# Patient Record
Sex: Female | Born: 1995
Health system: Southern US, Community
[De-identification: ages and names within clinical notes are randomized; demographics above are authoritative.]

## PROBLEM LIST (undated history)

## (undated) ENCOUNTER — Emergency Department (HOSPITAL_COMMUNITY): Admission: EM | Payer: Medicaid Other | Source: Home / Self Care

## (undated) DIAGNOSIS — Z789 Other specified health status: Secondary | ICD-10-CM

## (undated) DIAGNOSIS — E559 Vitamin D deficiency, unspecified: Secondary | ICD-10-CM

## (undated) HISTORY — DX: Other specified health status: Z78.9

## (undated) HISTORY — PX: NO PAST SURGERIES: SHX2092

---

## 2014-10-09 ENCOUNTER — Encounter (HOSPITAL_COMMUNITY): Payer: Self-pay | Admitting: *Deleted

## 2014-10-09 DIAGNOSIS — R1030 Lower abdominal pain, unspecified: Secondary | ICD-10-CM | POA: Insufficient documentation

## 2014-10-09 DIAGNOSIS — R110 Nausea: Secondary | ICD-10-CM | POA: Insufficient documentation

## 2014-10-09 DIAGNOSIS — R109 Unspecified abdominal pain: Secondary | ICD-10-CM | POA: Diagnosis present

## 2014-10-09 DIAGNOSIS — N939 Abnormal uterine and vaginal bleeding, unspecified: Secondary | ICD-10-CM | POA: Diagnosis not present

## 2014-10-09 LAB — CBC WITH DIFFERENTIAL/PLATELET
Basophils Absolute: 0 10*3/uL (ref 0.0–0.1)
Basophils Relative: 0 % (ref 0–1)
EOS ABS: 0.1 10*3/uL (ref 0.0–0.7)
Eosinophils Relative: 1 % (ref 0–5)
HCT: 38.9 % (ref 36.0–46.0)
Hemoglobin: 13 g/dL (ref 12.0–15.0)
Lymphocytes Relative: 42 % (ref 12–46)
Lymphs Abs: 3.2 10*3/uL (ref 0.7–4.0)
MCH: 29.3 pg (ref 26.0–34.0)
MCHC: 33.4 g/dL (ref 30.0–36.0)
MCV: 87.8 fL (ref 78.0–100.0)
MONO ABS: 0.4 10*3/uL (ref 0.1–1.0)
MONOS PCT: 5 % (ref 3–12)
Neutro Abs: 4.1 10*3/uL (ref 1.7–7.7)
Neutrophils Relative %: 52 % (ref 43–77)
Platelets: 262 10*3/uL (ref 150–400)
RBC: 4.43 MIL/uL (ref 3.87–5.11)
RDW: 13.3 % (ref 11.5–15.5)
WBC: 7.8 10*3/uL (ref 4.0–10.5)

## 2014-10-09 LAB — COMPREHENSIVE METABOLIC PANEL
ALK PHOS: 92 U/L (ref 39–117)
ALT: 13 U/L (ref 0–35)
ANION GAP: 14 (ref 5–15)
AST: 16 U/L (ref 0–37)
Albumin: 3.4 g/dL — ABNORMAL LOW (ref 3.5–5.2)
BUN: 13 mg/dL (ref 6–23)
CALCIUM: 9.4 mg/dL (ref 8.4–10.5)
CO2: 23 mEq/L (ref 19–32)
Chloride: 104 mEq/L (ref 96–112)
Creatinine, Ser: 0.9 mg/dL (ref 0.50–1.10)
GFR calc non Af Amer: >90 mL/min (ref >90)
GLUCOSE: 109 mg/dL — AB (ref 70–99)
Potassium: 3.9 mEq/L (ref 3.7–5.3)
Sodium: 141 mEq/L (ref 137–147)
TOTAL PROTEIN: 7.6 g/dL (ref 6.0–8.3)
Total Bilirubin: 0.4 mg/dL (ref 0.3–1.2)

## 2014-10-09 LAB — URINALYSIS, ROUTINE W REFLEX MICROSCOPIC
Bilirubin Urine: NEGATIVE
Glucose, UA: NEGATIVE mg/dL
HGB URINE DIPSTICK: NEGATIVE
Ketones, ur: NEGATIVE mg/dL
Leukocytes, UA: NEGATIVE
Nitrite: NEGATIVE
PROTEIN: NEGATIVE mg/dL
Specific Gravity, Urine: 1.025 (ref 1.005–1.030)
UROBILINOGEN UA: 1 mg/dL (ref 0.0–1.0)
pH: 6 (ref 5.0–8.0)

## 2014-10-09 LAB — LIPASE, BLOOD: Lipase: 24 U/L (ref 11–59)

## 2014-10-09 LAB — PREGNANCY, URINE: Preg Test, Ur: NEGATIVE

## 2014-10-09 NOTE — ED Notes (Signed)
The pt is c/o lower abd and  Lower back pain for 2-3 days.  lmp 7 years depo shot.  No period fior 7 years until 2 nights ago when she woke up bleeding.  Minimal blood

## 2014-10-10 ENCOUNTER — Emergency Department (HOSPITAL_COMMUNITY)
Admission: EM | Admit: 2014-10-10 | Discharge: 2014-10-10 | Disposition: A | Discharge status: Home / Self Care | Payer: Medicaid Other | Attending: Emergency Medicine | Admitting: Emergency Medicine

## 2014-10-10 DIAGNOSIS — R1030 Lower abdominal pain, unspecified: Secondary | ICD-10-CM

## 2014-10-10 NOTE — ED Provider Notes (Signed)
CSN: [id number]     Arrival date & time 10/09/14  2143 History   First MD Initiated Contact with Patient 10/10/14 0111     Chief Complaint  Patient presents with  . Abdominal Pain      Patient is a 18 y.o. female presenting with abdominal pain. The history is provided by the patient.  Abdominal Pain Pain location:  LLQ and RLQ Pain quality: cramping   Pain radiates to:  Does not radiate Pain severity:  Mild Onset quality:  Gradual Duration:  2 weeks Timing:  Intermittent Progression:  Unchanged Chronicity:  New Relieved by:  None tried Worsened by:  Nothing tried Associated symptoms: nausea and vaginal bleeding   Associated symptoms: no diarrhea, no dysuria, no fever, no vaginal discharge and no vomiting     PMH - none  History  Substance Use Topics  . Smoking status: Never Smoker   . Smokeless tobacco: Not on file  . Alcohol Use: Yes   OB History    No data available     Review of Systems  Constitutional: Negative for fever.  Gastrointestinal: Positive for nausea and abdominal pain. Negative for vomiting and diarrhea.  Genitourinary: Positive for vaginal bleeding. Negative for dysuria and vaginal discharge.  All other systems reviewed and are negative.     Allergies  Review of patient's allergies indicates no known allergies.  Home Medications   Prior to Admission medications   Not on File   BP 113/72 mmHg  Pulse 84  Temp(Src) 98.6 F (37 C)  Resp 15  Wt 305 lb 2 oz (138.404 kg)  SpO2 100% Physical Exam CONSTITUTIONAL: Well developed/well nourished HEAD: Normocephalic/atraumatic EYES: EOMI/PERRL ENMT: Mucous membranes moist NECK: supple no meningeal signs SPINE:entire spine nontender CV: S1/S2 noted, no murmurs/rubs/gallops noted LUNGS: Lungs are clear to auscultation bilaterally, no apparent distress ABDOMEN: soft, nontender, no rebound or guarding GU:no cva tenderness NEURO: Pt is awake/alert, moves all extremitiesx4 EXTREMITIES: pulses  normal, full ROM SKIN: warm, color normal PSYCH: no abnormalities of mood noted  ED Course  Procedures  Labs Review Labs Reviewed  COMPREHENSIVE METABOLIC PANEL - Abnormal; Notable for the following:    Glucose, Bld 109 (*)    Albumin 3.4 (*)    All other components within normal limits  CBC WITH DIFFERENTIAL  LIPASE, BLOOD  URINALYSIS, ROUTINE W REFLEX MICROSCOPIC  PREGNANCY, URINE    Pt refused pelvic exam She requested discharge home We discussed strict return precautions   MDM   Final diagnoses:  Lower abdominal pain    Nursing notes including past medical history and social history reviewed and considered in documentation Labs/vital reviewed and considered     Joya Gaskinsonald W [place] Sarinana, MD 10/10/14 [phone]

## 2014-10-10 NOTE — Discharge Instructions (Signed)

## 2016-02-05 ENCOUNTER — Emergency Department (HOSPITAL_COMMUNITY)
Admission: EM | Admit: 2016-02-05 | Discharge: 2016-02-05 | Disposition: A | Discharge status: Home / Self Care | Payer: Medicaid Other | Attending: Emergency Medicine | Admitting: Emergency Medicine

## 2016-02-05 ENCOUNTER — Encounter (HOSPITAL_COMMUNITY): Payer: Self-pay | Admitting: Emergency Medicine

## 2016-02-05 DIAGNOSIS — M654 Radial styloid tenosynovitis [de Quervain]: Secondary | ICD-10-CM | POA: Insufficient documentation

## 2016-02-05 MED ORDER — TRAMADOL HCL 50 MG PO TABS: 50.0000 mg | ORAL_TABLET | Freq: Four times a day (QID) | ORAL | Status: DC | PRN

## 2016-02-05 MED ORDER — IBUPROFEN 800 MG PO TABS: 800.0000 mg | ORAL_TABLET | Freq: Three times a day (TID) | ORAL | Status: DC | PRN

## 2016-02-05 NOTE — Discharge Instructions (Signed)
All of the hand surgeon provided.  Return here as needed.  Ice and heat to your wrist.  Wear the splint for comfort

## 2016-02-05 NOTE — ED Provider Notes (Signed)
CSN: [id number]     Arrival date & time 02/05/16  1301 History  By signing my name below, I, Kristine Garcia, attest that this documentation has been prepared under the direction and in the presence of Ebbie Ridgehris [name], [employer] CorporationPA-C Electronically Signed: Soijett Garcia, ED Scribe. 02/05/2016. 1:32 PM.   Chief Complaint  Patient presents with  . Wrist Pain      The history is provided by the patient. No language interpreter was used.    Kristine Garcia is a 20 y.o. female who presents to the Emergency Department complaining of constant, non-radiating, left wrist pain onset 5 days. Pt initially thought that she slept on her left wrist incorrectly, but she has pain primarily when the area is palpated. Pt states that there is a "popping" sensation to the left wrist. Pt denies any injury/truama to the area. She notes that she has not tried any medications for the relief of her symptoms. She denies color change, wound, rash, left elbow pain, and any other symptoms.    History reviewed. No pertinent past medical history. History reviewed. No pertinent past surgical history. No family history on file. Social History  Substance Use Topics  . Smoking status: Never Smoker   . Smokeless tobacco: None  . Alcohol Use: Yes   OB History    No data available     Review of Systems  Musculoskeletal: Positive for arthralgias. Negative for joint swelling.  Skin: Negative for color change, rash and wound.      Allergies  Review of patient's allergies indicates no known allergies.  Home Medications   Prior to Admission medications   Not on File   BP 109/73 mmHg  Pulse 75  Temp(Src) 98.4 F (36.9 C) (Oral)  Resp 18  Ht 5\' 8"  (1.727 m)  Wt 309 lb 2 oz (140.218 kg)  BMI 47.01 kg/m2  SpO2 99% Physical Exam  Constitutional: She is oriented to person, place, and time. She appears well-developed and well-nourished. No distress.  HENT:  Head: Normocephalic and atraumatic.  Eyes: EOM are normal.  Neck: Neck  supple.  Cardiovascular: Normal rate.   Pulmonary/Chest: Effort normal. No respiratory distress.  Musculoskeletal: Normal range of motion.  Left wrist: Tender over the lateral wrist and with flexion and extension. Flexion increases the pain.   Neurological: She is alert and oriented to person, place, and time.  Skin: Skin is warm and dry.  Psychiatric: She has a normal mood and affect. Her behavior is normal.  Nursing note and vitals reviewed.   ED Course  Procedures (including critical care time) DIAGNOSTIC STUDIES: Oxygen Saturation is 99% on RA, nl by my interpretation.    COORDINATION OF CARE: 1:31 PM Discussed treatment plan with pt at bedside which includes alternate ice and heat, splint, follow up with orthopedist, and pt agreed to plan.    Patient has no trauma noted to the wrist the field, the patient has tendinitis and she has a positive de Quervain's sign.  Patient will be advised ice and elevate her wrist.  We will give her follow-up with hand.  Told to return here as needed  Charlestine NightChristopher Emilyrose Darrah, PA-C 02/05/16 1338  Raeford RazorStephen Kohut, MD 02/06/16 [phone]

## 2016-02-05 NOTE — ED Notes (Signed)
Pt c/o pain to left wrist onset Monday. Pt denies injury.

## 2016-02-05 NOTE — ED Notes (Signed)
Declined W/C at D/C and was escorted to lobby by RN. 

## 2016-05-14 ENCOUNTER — Encounter (HOSPITAL_COMMUNITY): Payer: Self-pay

## 2016-05-14 ENCOUNTER — Emergency Department (HOSPITAL_COMMUNITY)
Admission: EM | Admit: 2016-05-14 | Discharge: 2016-05-14 | Disposition: A | Discharge status: Home / Self Care | Payer: Medicaid Other | Attending: Emergency Medicine | Admitting: Emergency Medicine

## 2016-05-14 DIAGNOSIS — Z791 Long term (current) use of non-steroidal anti-inflammatories (NSAID): Secondary | ICD-10-CM | POA: Insufficient documentation

## 2016-05-14 DIAGNOSIS — R6883 Chills (without fever): Secondary | ICD-10-CM | POA: Insufficient documentation

## 2016-05-14 DIAGNOSIS — R51 Headache: Secondary | ICD-10-CM | POA: Insufficient documentation

## 2016-05-14 DIAGNOSIS — M791 Myalgia: Secondary | ICD-10-CM | POA: Insufficient documentation

## 2016-05-14 DIAGNOSIS — J029 Acute pharyngitis, unspecified: Secondary | ICD-10-CM

## 2016-05-14 MED ORDER — IBUPROFEN 600 MG PO TABS: 600.0000 mg | ORAL_TABLET | Freq: Four times a day (QID) | ORAL | Status: DC | PRN

## 2016-05-14 MED ORDER — HYDROCODONE-ACETAMINOPHEN 7.5-325 MG/15ML PO SOLN: 15.0000 mL | Freq: Three times a day (TID) | ORAL | Status: DC | PRN

## 2016-05-14 MED ORDER — PREDNISONE 20 MG PO TABS: 40.0000 mg | ORAL_TABLET | Freq: Every day | ORAL | Status: DC

## 2016-05-14 NOTE — ED Provider Notes (Signed)
CSN: [id number]     Arrival date & time 05/14/16  1634 History  By signing my name below, I, Evon Slackerrance Branch, attest that this documentation has been prepared under the direction and in the presence of [employer]ole Elizabeth Ashvin Adelson, [place]. Electronically Signed: Evon Slackerrance Branch, ED Scribe. 05/14/2016. 5:11 PM.    Chief Complaint  Patient presents with  . Sore Throat   The history is provided by the patient. No language interpreter was used.   HPI Comments: Antoine Primasyesha Majid is a 20 y.o. female who presents to the Emergency Department complaining of constant worsening sore throat onset 2 days prior. Pt reports associated chills, generalized myalgias, HA, rhinorrhea. Pt also reports having a cough with associated episode of post tussive vomiting. Pt states that she has tried ibuprofen PTA with temporary relief. Pt states that the pain is worse when swallowing. Denies voice change, trouble swallowing, neck swelling, ear pain, wheezing, SOB, CP or congestion. Pt denies recent sick contacts.   History reviewed. No pertinent past medical history. History reviewed. No pertinent past surgical history. No family history on file. Social History  Substance Use Topics  . Smoking status: Never Smoker   . Smokeless tobacco: None  . Alcohol Use: Yes   OB History    No data available      Review of Systems  Constitutional: Positive for chills.  HENT: Positive for sore throat. Negative for congestion, ear pain, sinus pressure, trouble swallowing and voice change.   Respiratory: Positive for cough. Negative for shortness of breath.   Cardiovascular: Negative for chest pain.  Gastrointestinal: Negative for nausea and vomiting.  Musculoskeletal: Positive for myalgias.  Neurological: Positive for headaches.     Allergies  Review of patient's allergies indicates no known allergies.  Home Medications   Prior to Admission medications   Medication Sig Start Date End Date Taking? Authorizing Provider   HYDROcodone-acetaminophen (HYCET) 7.5-325 mg/15 ml solution Take 15 mLs by mouth every 8 (eight) hours as needed for moderate pain. 05/14/16   Barrett HenleNicole Elizabeth Tanisa Lagace, PA-C  ibuprofen (ADVIL,MOTRIN) 600 MG tablet Take 1 tablet (600 mg total) by mouth every 6 (six) hours as needed. 05/14/16   Barrett HenleNicole Elizabeth Marilouise Densmore, PA-C  predniSONE (DELTASONE) 20 MG tablet Take 2 tablets (40 mg total) by mouth daily. 05/14/16   Barrett HenleNicole Elizabeth Maxey Ransom, PA-C  traMADol (ULTRAM) 50 MG tablet Take 1 tablet (50 mg total) by mouth every 6 (six) hours as needed. 02/05/16   Christopher Lawyer, PA-C   BP 120/74 mmHg  Pulse 98  Temp(Src) 99.2 F (37.3 C) (Oral)  Resp 18  SpO2 99%   Physical Exam  Constitutional: She is oriented to person, place, and time. She appears well-developed and well-nourished.  HENT:  Head: Normocephalic and atraumatic.  Right Ear: Tympanic membrane normal.  Left Ear: Tympanic membrane normal.  Nose: Nose normal. Right sinus exhibits no maxillary sinus tenderness and no frontal sinus tenderness. Left sinus exhibits no maxillary sinus tenderness and no frontal sinus tenderness.  Mouth/Throat: Uvula is midline and mucous membranes are normal. No oral lesions. No trismus in the jaw. No uvula swelling. Posterior oropharyngeal erythema present. No oropharyngeal exudate, posterior oropharyngeal edema or tonsillar abscesses.  Eyes: Conjunctivae and EOM are normal. Right eye exhibits no discharge. Left eye exhibits no discharge. No scleral icterus.  Neck: Normal range of motion. Neck supple.  Cardiovascular: Normal rate, regular rhythm, normal heart sounds and intact distal pulses.   Pulmonary/Chest: Effort normal and breath sounds normal. No respiratory distress. She has no  wheezes. She has no rales. She exhibits no tenderness.  Abdominal: Soft. She exhibits no distension.  Musculoskeletal: Normal range of motion. She exhibits no edema.  Lymphadenopathy:    She has no cervical adenopathy.   Neurological: She is alert and oriented to person, place, and time.  Skin: Skin is warm and dry.  Nursing note and vitals reviewed.   ED Course  Procedures (including critical care time) DIAGNOSTIC STUDIES: Oxygen Saturation is 99% on RA, normal by my interpretation.    COORDINATION OF CARE: 5:11 PM-Discussed treatment plan with pt at bedside and pt agreed to plan.     Labs Review Labs Reviewed - No data to display  Imaging Review No results found.    EKG Interpretation None      MDM   Final diagnoses:  Sore throat   Pt afebrile with tonsillar swelling and erythema and dysphagia, no cervical lymphadenopathy. Centor criteria score 1, strep test not indicated. I suspect patient's symptoms are likely due to viral pharyngitis.  Pt appears well hydrated, discussed importance of continued water rehydration. Presentation non concerning for PTA or infxn spread to soft tissue. No trismus or uvula deviation. Specific return precautions discussed. Plan to discharge patient home with steroids, NSAIDs and symptomatic treatment. Patient given info to follow-up with PCP outpatient. Discussed return precautions with patient.   I personally performed the services described in this documentation, which was scribed in my presence. The recorded information has been reviewed and is accurate.      Satira Sark Avella, [place] 05/14/16 1729  Alvira Monday, MD 05/15/16 1226

## 2016-05-14 NOTE — ED Notes (Signed)
Patient complains of 2 days of cough, congestion, and sore throat

## 2016-05-14 NOTE — Discharge Instructions (Signed)
Take your medications as prescribed. Please follow up with a primary care provider from the Resource Guide provided below in 5 days if your symptoms have not improved. Please return to the Emergency Department if symptoms worsen or new onset of fever, facial/neck swelling, neck stiffness, unable to open jaw, change in voice, drooling, unable to swallow, difficulty breathing.

## 2016-05-14 NOTE — ED Notes (Signed)
Declined W/C at D/C and was escorted to lobby by RN. 

## 2016-06-26 ENCOUNTER — Encounter (HOSPITAL_COMMUNITY): Payer: Self-pay

## 2016-06-26 DIAGNOSIS — Z202 Contact with and (suspected) exposure to infections with a predominantly sexual mode of transmission: Secondary | ICD-10-CM | POA: Insufficient documentation

## 2016-06-26 DIAGNOSIS — Z5321 Procedure and treatment not carried out due to patient leaving prior to being seen by health care provider: Secondary | ICD-10-CM | POA: Insufficient documentation

## 2016-06-26 NOTE — ED Triage Notes (Signed)
Pt states recent exposure to herpes. Requesting testing. Denies any pain or discharge.

## 2016-06-27 ENCOUNTER — Ambulatory Visit (HOSPITAL_COMMUNITY)
Admission: EM | Admit: 2016-06-27 | Discharge: 2016-06-27 | Disposition: A | Discharge status: Home / Self Care | Payer: Medicaid Other | Attending: Internal Medicine | Admitting: Internal Medicine

## 2016-06-27 ENCOUNTER — Encounter (HOSPITAL_COMMUNITY): Payer: Self-pay | Admitting: Emergency Medicine

## 2016-06-27 ENCOUNTER — Emergency Department (HOSPITAL_COMMUNITY)
Admission: EM | Admit: 2016-06-27 | Discharge: 2016-06-27 | Disposition: A | Discharge status: Home / Self Care | Payer: Medicaid Other | Attending: Dermatology | Admitting: Dermatology

## 2016-06-27 DIAGNOSIS — A6000 Herpesviral infection of urogenital system, unspecified: Secondary | ICD-10-CM

## 2016-06-27 MED ORDER — ACYCLOVIR 400 MG PO TABS: 400.0000 mg | ORAL_TABLET | Freq: Three times a day (TID) | ORAL | 0 refills | Status: AC

## 2016-06-27 NOTE — ED Triage Notes (Addendum)
Pt is concerned that she was exposed to Herpes. No symptoms.

## 2016-06-27 NOTE — ED Provider Notes (Signed)
[facility]    CSN: [id number] Arrival date & time: 06/27/16  1004  First Provider Contact:  None       History   Chief Complaint Chief Complaint  Patient presents with  . Exposure to STD    HPI Kristine Garcia is a 20 y.o. female. She presents today with a 24-hour history of a tingling discomfort in the right anterior vulvar area. A recent sexual partner has a history of herpes. No unusual vaginal discharge/bleeding. No pelvic discomfort. No dysuria. No change in bowel habits. She declines STD testing, and says that she has had this done recently. She is mostly concerned about whether she has a herpes outbreak in the area of discomfort.  HPI  History reviewed. No pertinent past medical history.  There are no active problems to display for this patient.   History reviewed. No pertinent surgical history.     Home Medications    Prior to Admission medications   Medication Sig Start Date End Date Taking? Authorizing Provider  HYDROcodone-acetaminophen (HYCET) 7.5-325 mg/15 ml solution Take 15 mLs by mouth every 8 (eight) hours as needed for moderate pain. 05/14/16   Barrett Henle, PA-C  ibuprofen (ADVIL,MOTRIN) 600 MG tablet Take 1 tablet (600 mg total) by mouth every 6 (six) hours as needed. 05/14/16   Barrett Henle, PA-C  predniSONE (DELTASONE) 20 MG tablet Take 2 tablets (40 mg total) by mouth daily. 05/14/16   Barrett Henle, PA-C  traMADol (ULTRAM) 50 MG tablet Take 1 tablet (50 mg total) by mouth every 6 (six) hours as needed. 02/05/16   Charlestine Night, PA-C    Family History No family history on file.  Social History Social History  Substance Use Topics  . Smoking status: Never Smoker  . Smokeless tobacco: Never Used  . Alcohol use Yes     Comment: social     Allergies   Review of patient's allergies indicates no known allergies.   Review of Systems Review of Systems  All other systems reviewed and are  negative.    Physical Exam Triage Vital Signs ED Triage Vitals  Enc Vitals Group     BP 06/27/16 1045 (!) 156/113     Pulse Rate 06/27/16 1045 78     Resp 06/27/16 1045 18     Temp 06/27/16 1045 98.6 F (37 C)     Temp Source 06/27/16 1045 Oral     SpO2 06/27/16 1045 100 %     Pain Score 06/27/16 1146 0    Updated Vital Signs BP 127/81 (BP Location: Left Arm)   Pulse 78   Temp 98.6 F (37 C) (Oral)   Resp 18   SpO2 100%  Physical Exam  Constitutional: She appears well-developed and well-nourished. No distress.  HENT:  Head: Normocephalic and atraumatic.  Eyes: Conjunctivae are normal.  Neck: Neck supple.  Cardiovascular: Normal rate.   No murmur heard. Pulmonary/Chest: Effort normal. No respiratory distress.  Abdominal: There is no tenderness.  Genitourinary:  Genitourinary Comments: Vulvar exam is notable for an irregular 2 x 3 cm shallow erosion in the right periurethral/periclitoral area. This is tender, and consistent with a herpetic erosion.  Musculoskeletal: She exhibits no edema.  Neurological: She is alert.  Skin: Skin is warm and dry.  Psychiatric: She has a normal mood and affect.  Nursing note and vitals reviewed.    UC Treatments / Results  Labs (all labs ordered are listed, but only abnormal results are displayed) Labs Reviewed -  No data to display  EKG  EKG Interpretation None       Radiology No results found.  Procedures Procedures (including critical care time) none  Medications Ordered in UC Medications - No data to display   Final Clinical Impressions(s) / UC Diagnoses   Final diagnoses:  Genital HSV    New Prescriptions Discharge Medication List as of 06/27/2016 12:30 PM    START taking these medications   Details  acyclovir (ZOVIRAX) 400 MG tablet Take 1 tablet (400 mg total) by mouth 3 (three) times daily., Starting Tue 06/27/2016, Until Tue 07/04/2016, Normal         Eustace Moore, MD 07/06/16 [phone]

## 2016-06-27 NOTE — ED Notes (Signed)
Plan  Of  Care  Discussed   With  Pt

## 2016-06-27 NOTE — Discharge Instructions (Signed)
Prescription for acyclovir was sent to the [facility] on [address].

## 2016-07-12 ENCOUNTER — Ambulatory Visit (HOSPITAL_COMMUNITY)
Admission: EM | Admit: 2016-07-12 | Discharge: 2016-07-12 | Disposition: A | Discharge status: Home / Self Care | Payer: Medicaid Other | Attending: Family Medicine | Admitting: Family Medicine

## 2016-07-12 ENCOUNTER — Encounter (HOSPITAL_COMMUNITY): Payer: Self-pay | Admitting: Emergency Medicine

## 2016-07-12 DIAGNOSIS — B373 Candidiasis of vulva and vagina: Secondary | ICD-10-CM

## 2016-07-12 DIAGNOSIS — B3731 Acute candidiasis of vulva and vagina: Secondary | ICD-10-CM

## 2016-07-12 MED ORDER — FLUCONAZOLE 150 MG PO TABS: 150.0000 mg | ORAL_TABLET | Freq: Every day | ORAL | 0 refills | Status: DC

## 2016-07-12 NOTE — ED Triage Notes (Signed)
[facility] a [facility] discomfort and itching x 3 weeks. The patient stated that she was diagnosed with herpes on 06/27/2016 and prescribed acyclovir 400 mg which she has finished.

## 2016-07-12 NOTE — ED Provider Notes (Signed)
CSN: [id number]     Arrival date & time 07/12/16  1135 History   First MD Initiated Contact with Patient 07/12/16 1159     Chief Complaint  Patient presents with  . Vaginal Itching   (Consider location/radiation/quality/duration/timing/severity/associated sxs/prior Treatment) [name] is a 20 y.o female with no medical history, presents today for vaginal itchiness x 2 weeks accompany by white thick vaginal discharge . She was recently treated on 07/25 for genital herpes with acyclovir 400mg  TID with a irregular 2 x 3 cm shallow erosion lesion on her external genitalia noted on exam that day. Patient reports that the rash is still present, and would like another stronger course of acyclovir. Patient haven't been sexually active since last visit. She declines HIV testing today.       Vaginal Itching  Pertinent negatives include no chest pain and no shortness of breath.    History reviewed. No pertinent past medical history. History reviewed. No pertinent surgical history. History reviewed. No pertinent family history. Social History  Substance Use Topics  . Smoking status: Never Smoker  . Smokeless tobacco: Never Used  . Alcohol use Yes     Comment: social   OB History    No data available     Review of Systems  Constitutional: Negative for chills, fatigue and fever.  Respiratory: Negative for cough and shortness of breath.   Cardiovascular: Negative for chest pain and palpitations.  Genitourinary: Positive for vaginal discharge. Negative for dysuria, pelvic pain and vaginal pain.       Pos for vaginal itchiness, pos for a rash    Allergies  Review of patient's allergies indicates no known allergies.  Home Medications   Prior to Admission medications   Medication Sig Start Date End Date Taking? Authorizing Provider  fluconazole (DIFLUCAN) 150 MG tablet Take 1 tablet (150 mg total) by mouth daily. Take 1 tab today, repeat in 72 hours if still symptomatic 07/12/16   Lucia EstelleFeng  Kerra Guilfoil, NP  HYDROcodone-acetaminophen (HYCET) 7.5-325 mg/15 ml solution Take 15 mLs by mouth every 8 (eight) hours as needed for moderate pain. 05/14/16   Barrett HenleNicole Elizabeth Nadeau, PA-C  ibuprofen (ADVIL,MOTRIN) 600 MG tablet Take 1 tablet (600 mg total) by mouth every 6 (six) hours as needed. 05/14/16   Barrett HenleNicole Elizabeth Nadeau, PA-C  predniSONE (DELTASONE) 20 MG tablet Take 2 tablets (40 mg total) by mouth daily. 05/14/16   Barrett HenleNicole Elizabeth Nadeau, PA-C  traMADol (ULTRAM) 50 MG tablet Take 1 tablet (50 mg total) by mouth every 6 (six) hours as needed. 02/05/16   Charlestine Nighthristopher Lawyer, PA-C   Meds Ordered and Administered this Visit  Medications - No data to display  BP 137/88 (BP Location: Left Arm)   Pulse 90   Temp 98.9 F (37.2 C) (Oral)   Resp 20   SpO2 100%  No data found.   Physical Exam  Constitutional: She appears well-developed and well-nourished.  Cardiovascular: Normal rate, regular rhythm and normal heart sounds.   Pulmonary/Chest: Effort normal and breath sounds normal.  Genitourinary:  Genitourinary Comments: Patient has no rash on her labia majora or minora. White thick vaginal discharge noted on the vaginal opening, more similar discharge noted in the vaginal canal. N lesion or rash noted on the vaginal canal. Cervix unremarkable. Negative for CMT.   Nursing note and vitals reviewed.   Urgent Care Course   Clinical Course    Procedures (including critical care time)  Labs Review Labs Reviewed  CERVICOVAGINAL ANCILLARY ONLY  Imaging Review No results found.   Visual Acuity Review  Right Eye Distance:   Left Eye Distance:   Bilateral Distance:    Right Eye Near:   Left Eye Near:    Bilateral Near:         MDM   1. Candida vaginitis    Kristine Garcia is a 20 y.o female with no medical history, presents today for vaginal itchiness x 2 weeks accompany by white thick vaginal discharge . She was recently treated on 07/25 for genital herpes with acyclovir   TID with a irregular 2 x 3 cm shallow erosion lesion on her external genitalia noted on exam that day. This rash has resolved and is no longer seem on the exam today. Yeast vaginitis is suspected. Rx given for diflucan; instructed to take 1 tab today and may repeat in 72 hours if she is stisl symptomatic. Instructed to follow up with her primary care provider or return to our urgent care if she does not improve.     Lucia Estelle, NP 07/12/16 1306

## 2016-07-13 LAB — CERVICOVAGINAL ANCILLARY ONLY
CHLAMYDIA, DNA PROBE: NEGATIVE
NEISSERIA GONORRHEA: NEGATIVE
Wet Prep (BD Affirm): POSITIVE — AB

## 2016-07-15 ENCOUNTER — Telehealth (HOSPITAL_COMMUNITY): Payer: Self-pay | Admitting: Emergency Medicine

## 2016-07-15 MED ORDER — METRONIDAZOLE 500 MG PO TABS: 2000.0000 mg | ORAL_TABLET | Freq: Once | ORAL | 0 refills | Status: AC

## 2016-07-15 NOTE — Telephone Encounter (Addendum)
Per Sherrilee GillesZheng, NP,  Notes Recorded by Lucia EstelleFeng Zheng, NP on 07/15/2016 at 10:02 AM EDT Please inform patient that her result came back positive for yeast and trichomonas. She have been already been treated for yeast. For her Trichomonas; please call in Flagyl 500mg  tablets; Take 4 tablets, once.   Called pt and notified of recent lab results from visit 8/9 Pt ID'd properly... Reports feeling better and sx have subsided Adv pt if sx are not getting better to return and to notify partner(s) Per her request, called in Flagyl to [facility] Education on safe sex given Pt verb understanding.

## 2016-09-08 ENCOUNTER — Encounter (HOSPITAL_COMMUNITY): Payer: Self-pay | Admitting: Family Medicine

## 2016-09-08 ENCOUNTER — Ambulatory Visit (HOSPITAL_COMMUNITY)
Admission: EM | Admit: 2016-09-08 | Discharge: 2016-09-08 | Disposition: A | Discharge status: Home / Self Care | Payer: Medicaid Other | Attending: Family Medicine | Admitting: Family Medicine

## 2016-09-08 DIAGNOSIS — Z202 Contact with and (suspected) exposure to infections with a predominantly sexual mode of transmission: Secondary | ICD-10-CM

## 2016-09-08 DIAGNOSIS — L739 Follicular disorder, unspecified: Secondary | ICD-10-CM

## 2016-09-08 DIAGNOSIS — N760 Acute vaginitis: Secondary | ICD-10-CM

## 2016-09-08 DIAGNOSIS — N898 Other specified noninflammatory disorders of vagina: Secondary | ICD-10-CM | POA: Insufficient documentation

## 2016-09-08 MED ORDER — FLUCONAZOLE 150 MG PO TABS: ORAL_TABLET | ORAL | 0 refills | Status: DC

## 2016-09-08 MED ORDER — DOXYCYCLINE HYCLATE 100 MG PO CAPS: 100.0000 mg | ORAL_CAPSULE | Freq: Two times a day (BID) | ORAL | 0 refills | Status: AC

## 2016-09-08 MED ORDER — METRONIDAZOLE 500 MG PO TABS: 500.0000 mg | ORAL_TABLET | Freq: Two times a day (BID) | ORAL | 0 refills | Status: DC

## 2016-09-08 NOTE — Discharge Instructions (Signed)
Start Flagyl 500mg  twice a day for 7 days. Take Diflucan 1 tablet now, repeat 1 tablet in 4 days for yeast. Start Doxycyline twice a day for 7 days for infection. No alcohol. Follow-up pending lab results.

## 2016-09-08 NOTE — ED Triage Notes (Signed)
Pt here for vaginal irritation. sts her friend looked at her private area and saw a red bump.

## 2016-09-08 NOTE — ED Provider Notes (Signed)
CSN: [id number]     Arrival date & time 09/08/16  [id number] History   First MD Initiated Contact with Patient 09/08/16 1043     Chief Complaint  Patient presents with  . Vaginal Itching   (Consider location/radiation/quality/duration/timing/severity/associated sxs/prior Treatment) 20 year old female presents with bump on left inner labial area and vaginal discharge for the past week. Bump is slightly painful but no itching. Denies any fever, abdominal pain or dysuria. She was seen originally on 7/25 and thought to have genital herpes. She was prescribed Acyclovir but lesion did not change. She returned about 2 weeks later with more vaginal discharge and was dx with yeast vaginitis and placed on Diflucan. Lesion had resolved and testing at that time was negative for GC/Chlamydia but positive for yeast and Trichomonas. She took 4 pills of Flagyl but discharge never seemed to completely resolve. She has not been sexually active since her last visit in August.    The history is provided by the patient.    History reviewed. No pertinent past medical history. History reviewed. No pertinent surgical history. History reviewed. No pertinent family history. Social History  Substance Use Topics  . Smoking status: Never Smoker  . Smokeless tobacco: Never Used  . Alcohol use Yes     Comment: social   OB History    No data available     Review of Systems  Constitutional: Negative for chills, fatigue and fever.  Gastrointestinal: Negative for abdominal pain, diarrhea, nausea and vomiting.  Genitourinary: Positive for genital sores and vaginal discharge. Negative for dysuria, hematuria, pelvic pain, vaginal bleeding and vaginal pain.  Skin: Negative for rash.  Neurological: Negative for dizziness, weakness and headaches.  Hematological: Negative for adenopathy. Does not bruise/bleed easily.    Allergies  Review of patient's allergies indicates no known allergies.  Home Medications   Prior to  Admission medications   Medication Sig Start Date End Date Taking? Authorizing Provider  doxycycline (VIBRAMYCIN) 100 MG capsule Take 1 capsule (100 mg total) by mouth 2 (two) times daily. 09/08/16 09/15/16  Sudie GrumblingAnn Berry Swetha Rayle, NP  fluconazole (DIFLUCAN) 150 MG tablet Take 1 tablet by mouth once. Repeat 1 tablet in 4 days. 09/08/16   Sudie GrumblingAnn Berry Ericberto Padget, NP  ibuprofen (ADVIL,MOTRIN) 600 MG tablet Take 1 tablet (600 mg total) by mouth every 6 (six) hours as needed. 05/14/16   Barrett HenleNicole Elizabeth Nadeau, PA-C  metroNIDAZOLE (FLAGYL) 500 MG tablet Take 1 tablet (500 mg total) by mouth 2 (two) times daily. 09/08/16   Sudie GrumblingAnn Berry Thi Klich, NP   Meds Ordered and Administered this Visit  Medications - No data to display  BP (!) 147/104 (BP Location: Left Wrist)   Pulse 92   Temp 98.5 F (36.9 C) (Oral)   Resp 18   LMP 08/23/2016   SpO2 100%  No data found.   Physical Exam  Constitutional: She is oriented to person, place, and time. She appears well-developed and well-nourished. No distress.  Cardiovascular: Normal rate, regular rhythm and normal heart sounds.   Pulmonary/Chest: Effort normal and breath sounds normal.  Abdominal: Soft. Normal appearance. There is no tenderness.  Genitourinary:    There is no rash, tenderness or lesion on the right labia. There is lesion on the left labia. There is no rash or tenderness on the left labia. Cervix exhibits no motion tenderness. Right adnexum displays no tenderness. Left adnexum displays no tenderness. There is tenderness in the vagina. No bleeding in the vagina. Vaginal discharge found.  Genitourinary Comments:  A few small red papular lesions present on left inner labial area. Slightly tender with minimal pus present. No ulcers seen.   Vaginal canal- White thin discharge present with occasional clumps present.   Musculoskeletal: Normal range of motion.  Lymphadenopathy:       Right: No inguinal adenopathy present.       Left: No inguinal adenopathy present.    Neurological: She is alert and oriented to person, place, and time.  Skin: Skin is warm and dry.  Psychiatric: She has a normal mood and affect. Her behavior is normal. Judgment and thought content normal.    Urgent Care Course   Clinical Course    Procedures (including critical care time)  Labs Review Labs Reviewed  HSV 1 ANTIBODY, IGG  HSV 2 ANTIBODY, IGG  CERVICOVAGINAL ANCILLARY ONLY    Imaging Review No results found.   Visual Acuity Review  Right Eye Distance:   Left Eye Distance:   Bilateral Distance:    Right Eye Near:   Left Eye Near:    Bilateral Near:         MDM   1. Acute vaginitis   2. Potential exposure to STD   3. Folliculitis    Discussed that the bumps appear to be folliculitis- will start Doxycycline twice a day as directed. HSV blood work obtained to determine if ever exposed to herpes since uncertain if lesion was actually herpes in July. Discussed vaginitis- may be BV or Trich- start Flagyl 500mg  twice a day as directed for 7 days. Take Diflucan 150mg  1 tablet now- repeat 1 tablet in 4 days. Follow-up pending lab results.     Sudie Grumbling, NP 09/09/16 2116

## 2016-09-09 LAB — HSV 1 ANTIBODY, IGG: HSV 1 Glycoprotein G Ab, IgG: <0.91 index (ref 0.00–0.90)

## 2016-09-09 LAB — HSV 2 ANTIBODY, IGG

## 2016-09-11 LAB — CERVICOVAGINAL ANCILLARY ONLY
CHLAMYDIA, DNA PROBE: NEGATIVE
Neisseria Gonorrhea: NEGATIVE
Wet Prep (BD Affirm): NEGATIVE

## 2016-09-20 ENCOUNTER — Telehealth (HOSPITAL_COMMUNITY): Payer: Self-pay | Admitting: Emergency Medicine

## 2016-09-20 NOTE — Telephone Encounter (Signed)
Pt called needing lab results from visit on 09/08/16  Notified of recent lab results... NEG for HSV 1/2, neg for Gon/Chlam, Trich, BV Pt ID'd properly... Reports feeling better and sx have subsided Adv pt if sx are not getting better to return or to f/u w/PCP Pt verb understanding.

## 2016-12-10 ENCOUNTER — Emergency Department (HOSPITAL_COMMUNITY)
Admission: EM | Admit: 2016-12-10 | Discharge: 2016-12-10 | Disposition: A | Discharge status: Home / Self Care | Payer: Medicaid Other | Attending: Emergency Medicine | Admitting: Emergency Medicine

## 2016-12-10 ENCOUNTER — Encounter (HOSPITAL_COMMUNITY): Payer: Self-pay

## 2016-12-10 DIAGNOSIS — Z79899 Other long term (current) drug therapy: Secondary | ICD-10-CM | POA: Insufficient documentation

## 2016-12-10 DIAGNOSIS — K529 Noninfective gastroenteritis and colitis, unspecified: Secondary | ICD-10-CM | POA: Insufficient documentation

## 2016-12-10 MED ORDER — DIPHENOXYLATE-ATROPINE 2.5-0.025 MG PO TABS: 2.0000 | ORAL_TABLET | Freq: Four times a day (QID) | ORAL | 0 refills | Status: DC | PRN

## 2016-12-10 MED ORDER — ONDANSETRON HCL 4 MG PO TABS: 4.0000 mg | ORAL_TABLET | Freq: Four times a day (QID) | ORAL | 0 refills | Status: DC

## 2016-12-10 NOTE — ED Provider Notes (Signed)
[facility]-EMERGENCY DEPT Provider Note   CSN: [id number] Arrival date & time: 12/10/16  0010  By signing my name below, I, Arianna Nassar, attest that this documentation has been prepared under the direction and in the presence of Gilda Creasehristopher J Jaiquan Temme, MD.  Electronically Signed: Octavia HeirArianna Nassar, ED Scribe. 12/10/16. 2:16 AM.    History   Chief Complaint Chief Complaint  Patient presents with  . Emesis  . Diarrhea   The history is provided by the patient. No language interpreter was used.   HPI Comments: Kristine Garcia is a 21 y.o. female who presents to the Emergency Department complaining of moderate, gradual worsening, diarrhea that began 3 days ago. She has been having associated nausea, vomiting, and rhinorrhea. Pt was sent to the ED by her job for further evaluation and states she is unable to return without a medical note. She denies cough, congestion, sore throat, or abdominal pain.   History reviewed. No pertinent past medical history.  There are no active problems to display for this patient.   History reviewed. No pertinent surgical history.  OB History    No data available       Home Medications    Prior to Admission medications   Medication Sig Start Date End Date Taking? Authorizing Provider  diphenoxylate-atropine (LOMOTIL) 2.5-0.025 MG tablet Take 2 tablets by mouth 4 (four) times daily as needed for diarrhea or loose stools. 12/10/16   Gilda Creasehristopher J Japhet Morgenthaler, MD  fluconazole (DIFLUCAN) 150 MG tablet Take 1 tablet by mouth once. Repeat 1 tablet in 4 days. 09/08/16   Sudie GrumblingAnn Berry Amyot, NP  ibuprofen (ADVIL,MOTRIN) 600 MG tablet Take 1 tablet (600 mg total) by mouth every 6 (six) hours as needed. 05/14/16   Barrett HenleNicole Elizabeth Nadeau, PA-C  metroNIDAZOLE (FLAGYL) 500 MG tablet Take 1 tablet (500 mg total) by mouth 2 (two) times daily. 09/08/16   Sudie GrumblingAnn Berry Amyot, NP  ondansetron (ZOFRAN) 4 MG tablet Take 1 tablet (4 mg total) by mouth every 6 (six) hours. 12/10/16   Gilda Creasehristopher J  Jesly Hartmann, MD    Family History History reviewed. No pertinent family history.  Social History Social History  Substance Use Topics  . Smoking status: Never Smoker  . Smokeless tobacco: Never Used  . Alcohol use Yes     Comment: social     Allergies   Patient has no known allergies.   Review of Systems Review of Systems  HENT: Positive for rhinorrhea. Negative for congestion and sore throat.   Respiratory: Negative for cough.   Gastrointestinal: Positive for diarrhea, nausea and vomiting. Negative for abdominal pain.  All other systems reviewed and are negative.    Physical Exam Updated Vital Signs BP 135/84 (BP Location: Left Arm)   Pulse 72   Temp 98.5 F (36.9 C) (Oral)   Resp 18   Ht 5\' 8"  (1.727 m)   Wt 300 lb (136.1 kg)   SpO2 100%   BMI 45.61 kg/m   Physical Exam  Constitutional: She is oriented to person, place, and time. She appears well-developed and well-nourished. No distress.  HENT:  Head: Normocephalic and atraumatic.  Right Ear: Hearing normal.  Left Ear: Hearing normal.  Nose: Nose normal.  Mouth/Throat: Oropharynx is clear and moist and mucous membranes are normal.  Eyes: Conjunctivae and EOM are normal. Pupils are equal, round, and reactive to light.  Neck: Normal range of motion. Neck supple.  Cardiovascular: Regular rhythm, S1 normal and S2 normal.  Exam reveals no gallop and no friction rub.  No murmur heard. Pulmonary/Chest: Effort normal and breath sounds normal. No respiratory distress. She exhibits no tenderness.  Abdominal: Soft. Normal appearance and bowel sounds are normal. There is no hepatosplenomegaly. There is no tenderness. There is no rebound, no guarding, no tenderness at McBurney's point and negative Murphy's sign. No hernia.  Musculoskeletal: Normal range of motion.  Neurological: She is alert and oriented to person, place, and time. She has normal strength. No cranial nerve deficit or sensory deficit. Coordination normal.  GCS eye subscore is 4. GCS verbal subscore is 5. GCS motor subscore is 6.  Skin: Skin is warm, dry and intact. No rash noted. No cyanosis.  Psychiatric: She has a normal mood and affect. Her speech is normal and behavior is normal. Thought content normal.  Nursing note and vitals reviewed.    ED Treatments / Results  DIAGNOSTIC STUDIES: Oxygen Saturation is 100% on RA, normal by my interpretation.  COORDINATION OF CARE:  2:14 AM Discussed treatment plan with pt at bedside and pt agreed to plan.  Labs (all labs ordered are listed, but only abnormal results are displayed) Labs Reviewed - No data to display  EKG  EKG Interpretation None       Radiology No results found.  Procedures Procedures (including critical care time)  Medications Ordered in ED Medications - No data to display   Initial Impression / Assessment and Plan / ED Course  I have reviewed the triage vital signs and the nursing notes.  Pertinent labs & imaging results that were available during my care of the patient were reviewed by me and considered in my medical decision making (see chart for details).  Clinical Course    Patient with nausea, vomiting, diarrhea. Symptoms consistent with viral gastroenteritis. Abdominal exam is benign, nontender. No concern for acute surgical process. Vital signs normal. Remainder of examination unremarkable. Will treat symptomatically.  Final Clinical Impressions(s) / ED Diagnoses   Final diagnoses:  Gastroenteritis   I personally performed the services described in this documentation, which was scribed in my presence. The recorded information has been reviewed and is accurate.  New Prescriptions New Prescriptions   DIPHENOXYLATE-ATROPINE (LOMOTIL) 2.5-0.025 MG TABLET    Take 2 tablets by mouth 4 (four) times daily as needed for diarrhea or loose stools.   ONDANSETRON (ZOFRAN) 4 MG TABLET    Take 1 tablet (4 mg total) by mouth every 6 (six) hours.     Gilda Crease, MD 12/10/16 [phone]

## 2016-12-10 NOTE — ED Notes (Signed)
Pt requesting a meal at this time. MD aware.

## 2016-12-10 NOTE — ED Triage Notes (Signed)
Pt endorses diarrhea for 3 days and vomiting that began today. Pt states "My job sent me here because they wouldn't let me come back to work without a note" VSS. Pt denies abd pain.

## 2017-05-18 ENCOUNTER — Encounter (HOSPITAL_COMMUNITY): Payer: Self-pay | Admitting: Emergency Medicine

## 2017-05-18 ENCOUNTER — Emergency Department (HOSPITAL_COMMUNITY)
Admission: EM | Admit: 2017-05-18 | Discharge: 2017-05-18 | Disposition: A | Discharge status: Home / Self Care | Payer: Medicaid Other | Attending: Emergency Medicine | Admitting: Emergency Medicine

## 2017-05-18 DIAGNOSIS — L0291 Cutaneous abscess, unspecified: Secondary | ICD-10-CM

## 2017-05-18 DIAGNOSIS — Z113 Encounter for screening for infections with a predominantly sexual mode of transmission: Secondary | ICD-10-CM | POA: Insufficient documentation

## 2017-05-18 DIAGNOSIS — L02211 Cutaneous abscess of abdominal wall: Secondary | ICD-10-CM | POA: Insufficient documentation

## 2017-05-18 LAB — WET PREP, GENITAL
SPERM: NONE SEEN
Trich, Wet Prep: NONE SEEN
Yeast Wet Prep HPF POC: NONE SEEN

## 2017-05-18 LAB — I-STAT BETA HCG BLOOD, ED (MC, WL, AP ONLY): I-stat hCG, quantitative: <5 m[IU]/mL (ref <5)

## 2017-05-18 NOTE — ED Provider Notes (Signed)
[facility]-EMERG[facility]Y DEPT Provider Note   CSN: [id number] Arrival date & time: 05/18/17  1024  By signing my name below, I, Rosario AdieWilliam Andrew Hiatt, attest that this documentation has been prepared under the direction and in the presence of [employer] Steel Corporation[employer]ole Itzabella Sorrels, PA-C.  Electronically Signed: Rosario AdieWilliam Andrew Hiatt, ED Scribe. 05/18/17. 11:17 AM.  History   Chief Complaint Chief Complaint  Patient presents with  . Abscess  . SEXUALLY TRANSMITTED DISEASE   The history is provided by the patient. No language interpreter was used.    HPI Comments: Kristine Garcia is a 21 y.o. female with no pertinent PMHx, who presents to the Emergency Department complaining of a moderate, gradually worsening area of pain and swelling to the suprapubic area onset two weeks ago. She reports that while en route to the ED the area began draining purulent matter. Pt states pain is exacerbated with palpation and direct pressure. No noted treatments for her symptoms were tried prior to coming into the ED. She also mentions that she was recently diagnosed w/ Trichomonas one month ago which was treated by her [facility] and her symptoms from this have since resolved. Denies fever, chills, nausea, vomiting, abnormal vaginal discharge, or any other associated symptoms.   History reviewed. No pertinent past medical history.  There are no active problems to display for this patient.  History reviewed. No pertinent surgical history.  OB History    No data available     Home Medications    Prior to Admission medications   Medication Sig Start Date End Date Taking? Authorizing Provider  diphenoxylate-atropine (LOMOTIL) 2.5-0.025 MG tablet Take 2 tablets by mouth 4 (four) times daily as needed for diarrhea or loose stools. 12/10/16   Gilda CreasePollina, Christopher J, MD  fluconazole (DIFLUCAN) 150 MG tablet Take 1 tablet by mouth once. Repeat 1 tablet in 4 days. 09/08/16   Sudie GrumblingAmyot, Ann Berry, NP  ibuprofen (ADVIL,MOTRIN) 600 MG tablet  Take 1 tablet (600 mg total) by mouth every 6 (six) hours as needed. 05/14/16   Barrett HenleNadeau, Clarivel Callaway Elizabeth, PA-C  metroNIDAZOLE (FLAGYL) 500 MG tablet Take 1 tablet (500 mg total) by mouth 2 (two) times daily. 09/08/16   Sudie GrumblingAmyot, Ann Berry, NP  ondansetron (ZOFRAN) 4 MG tablet Take 1 tablet (4 mg total) by mouth every 6 (six) hours. 12/10/16   Gilda CreasePollina, Christopher J, MD   Family History No family history on file.  Social History Social History  Substance Use Topics  . Smoking status: Never Smoker  . Smokeless tobacco: Never Used  . Alcohol use Yes     Comment: social   Allergies   Patient has no known allergies.  Review of Systems Review of Systems   A complete review of systems was obtained and all systems are negative except as noted in the HPI and PMH.   Physical Exam Updated Vital Signs BP 116/71 (BP Location: Left Arm)   Pulse 73   Temp 98.6 F (37 C) (Oral)   Resp 18   Ht 5\' 8"  (1.727 m)   Wt (!) 144.7 kg (319 lb)   LMP 05/08/2017 (Exact Date)   SpO2 100%   BMI 48.50 kg/m   Physical Exam  Constitutional: She is oriented to person, place, and time. She appears well-developed and well-nourished. No distress.  HENT:  Head: Normocephalic and atraumatic.  Mouth/Throat: Oropharynx is clear and moist.  Eyes: Conjunctivae and EOM are normal. Pupils are equal, round, and reactive to light.  Neck: Normal range of motion.  Cardiovascular: Normal  rate, regular rhythm and intact distal pulses.   Pulmonary/Chest: Effort normal and breath sounds normal.  Abdominal: Soft. There is no tenderness.  Genitourinary:  Genitourinary Comments: Pelvic exam is chaperoned by RN: No rashes or lesions, no abnormal vaginal discharge, no cervical motion or adnexal tenderness.  Musculoskeletal: Normal range of motion.  Neurological: She is alert and oriented to person, place, and time.  Skin: Rash noted. She is not diaphoretic.  Multiple ingrown hairs  Psychiatric: She has a normal mood and  affect.  Nursing note and vitals reviewed.  ED Treatments / Results  DIAGNOSTIC STUDIES: Oxygen Saturation is 99% on RA, normal by my interpretation.   COORDINATION OF CARE: 11:17 AM-Discussed next steps with pt. Pt verbalized understanding and is agreeable with the plan.   Labs (all labs ordered are listed, but only abnormal results are displayed) Labs Reviewed  WET PREP, GENITAL - Abnormal; Notable for the following:       Result Value   Clue Cells Wet Prep HPF POC PRESENT (*)    WBC, Wet Prep HPF POC MANY (*)    All other components within normal limits  RPR  HIV ANTIBODY (ROUTINE TESTING)  I-STAT BETA HCG BLOOD, ED ([facility], [facility], [facility] ONLY)  GC/CHLAMYDIA PROBE AMP ([place]) NOT AT [facility]    EKG  EKG Interpretation None      Radiology No results found.  Procedures Procedures   Medications Ordered in ED Medications - No data to display  Initial Impression / Assessment and Plan / ED Course  I have reviewed the triage vital signs and the nursing notes.  Pertinent labs & imaging results that were available during my care of the patient were reviewed by me and considered in my medical decision making (see chart for details).     Vitals:   05/18/17 1030 05/18/17 1031 05/18/17 1211  BP:  134/80 116/71  Pulse:  71 73  Resp:  17 18  Temp:  98.6 F (37 C)   TempSrc:  Oral   SpO2:  99% 100%  Weight: (!) 144.7 kg (319 lb)    Height: 5\' 8"  (1.727 m)      Medications - No data to display  Kristine Garcia is 21 y.o. female presenting with ingrown pubic hair with small abscess that opened before arrival to ED. patient also would like to be checked for STDs, she states that she is asymptomatic with no vaginal discharge, pelvic exam is unremarkable. Negative trichomoniasis on wet prep. She does have clue cells however she is asymptomatic. Patient given referral to women[facility]. No signs of systemic infection with respect to the abscess.  Evaluation does not show pathology that  would require ongoing emergent intervention or inpatient treatment. Pt is hemodynamically stable and mentating appropriately. Discussed findings and plan with patient/guardian, who agrees with care plan. All questions answered. Return precautions discussed and outpatient follow up given.    Final Clinical Impressions(s) / ED Diagnoses   Final diagnoses:  Abscess  Screen for STD (sexually transmitted disease)   New Prescriptions New Prescriptions   No medications on file   I personally performed the services described in this documentation, which was scribed in my presence. The recorded information has been reviewed and is accurate.     Kaylyn Lim 05/18/17 1309    Vanetta Mulders, MD 05/30/17 [phone]

## 2017-05-18 NOTE — Discharge Instructions (Signed)
Please follow with your primary care doctor in the next 2 days for a check-up. They must obtain records for further management.  ° °Do not hesitate to return to the Emergency Department for any new, worsening or concerning symptoms.  ° °

## 2017-05-18 NOTE — ED Notes (Signed)
Advised by provider to change acuity to a 3

## 2017-05-18 NOTE — ED Triage Notes (Signed)
Pt reports "ingrown hair" on mons pubis, ongoing for 2 weeks, denies fever, drainage. Pt states, "It has a white head on the end of it, like it wants to pop."  Pt also asks to be checked for STDs, reports recent trich dx, states completed tx.

## 2017-05-19 LAB — RPR: RPR: NONREACTIVE

## 2017-05-19 LAB — HIV ANTIBODY (ROUTINE TESTING W REFLEX): HIV SCREEN 4TH GENERATION: NONREACTIVE

## 2017-05-21 LAB — GC/CHLAMYDIA PROBE AMP (~~LOC~~) NOT AT ARMC
Chlamydia: NEGATIVE
NEISSERIA GONORRHEA: NEGATIVE

## 2017-10-17 ENCOUNTER — Encounter (HOSPITAL_COMMUNITY): Payer: Self-pay

## 2017-10-17 ENCOUNTER — Ambulatory Visit (INDEPENDENT_AMBULATORY_CARE_PROVIDER_SITE_OTHER): Payer: Self-pay

## 2017-10-17 ENCOUNTER — Emergency Department (HOSPITAL_COMMUNITY): Admission: EM | Admit: 2017-10-17 | Discharge: 2017-10-17 | Discharge status: Against Medical Advice | Payer: Self-pay

## 2017-10-17 ENCOUNTER — Other Ambulatory Visit: Payer: Self-pay

## 2017-10-17 ENCOUNTER — Ambulatory Visit (HOSPITAL_COMMUNITY)
Admission: EM | Admit: 2017-10-17 | Discharge: 2017-10-17 | Disposition: A | Discharge status: Home / Self Care | Payer: Self-pay | Attending: Internal Medicine | Admitting: Internal Medicine

## 2017-10-17 DIAGNOSIS — R110 Nausea: Secondary | ICD-10-CM | POA: Insufficient documentation

## 2017-10-17 DIAGNOSIS — R1031 Right lower quadrant pain: Secondary | ICD-10-CM | POA: Insufficient documentation

## 2017-10-17 DIAGNOSIS — N939 Abnormal uterine and vaginal bleeding, unspecified: Secondary | ICD-10-CM | POA: Insufficient documentation

## 2017-10-17 DIAGNOSIS — R197 Diarrhea, unspecified: Secondary | ICD-10-CM | POA: Insufficient documentation

## 2017-10-17 LAB — POCT I-STAT, CHEM 8
BUN: 7 mg/dL (ref 6–20)
CREATININE: 0.7 mg/dL (ref 0.44–1.00)
Calcium, Ion: 1.19 mmol/L (ref 1.15–1.40)
Chloride: 104 mmol/L (ref 101–111)
GLUCOSE: 90 mg/dL (ref 65–99)
HCT: 39 % (ref 36.0–46.0)
HEMOGLOBIN: 13.3 g/dL (ref 12.0–15.0)
Potassium: 3.7 mmol/L (ref 3.5–5.1)
Sodium: 140 mmol/L (ref 135–145)
TCO2: 24 mmol/L (ref 22–32)

## 2017-10-17 LAB — POCT URINALYSIS DIP (DEVICE)
Bilirubin Urine: NEGATIVE
Glucose, UA: NEGATIVE mg/dL
Ketones, ur: NEGATIVE mg/dL
NITRITE: NEGATIVE
PH: 6 (ref 5.0–8.0)
PROTEIN: NEGATIVE mg/dL
Specific Gravity, Urine: >=1.03 (ref 1.005–1.030)
Urobilinogen, UA: 0.2 mg/dL (ref 0.0–1.0)

## 2017-10-17 LAB — POCT PREGNANCY, URINE: PREG TEST UR: NEGATIVE

## 2017-10-17 MED ORDER — AZITHROMYCIN 250 MG PO TABS
ORAL_TABLET | ORAL | Status: AC
  Filled 2017-10-17: qty 4

## 2017-10-17 MED ORDER — PEG 3350 17 GM/SCOOP PO POWD: 17.0000 g | Freq: Every day | ORAL | 0 refills | Status: AC

## 2017-10-17 MED ORDER — CEFTRIAXONE SODIUM 250 MG IJ SOLR
250.0000 mg | Freq: Once | INTRAMUSCULAR | Status: AC
  Administered 2017-10-17: 250 mg via INTRAMUSCULAR

## 2017-10-17 MED ORDER — AZITHROMYCIN 250 MG PO TABS
1000.0000 mg | ORAL_TABLET | Freq: Once | ORAL | Status: AC
  Administered 2017-10-17: 1000 mg via ORAL

## 2017-10-17 MED ORDER — CEFTRIAXONE SODIUM 250 MG IJ SOLR
INTRAMUSCULAR | Status: AC
  Filled 2017-10-17: qty 250

## 2017-10-17 MED ORDER — AZITHROMYCIN 250 MG PO TABS
ORAL_TABLET | ORAL | Status: AC
  Filled 2017-10-17: qty 3

## 2017-10-17 MED ORDER — LIDOCAINE HCL (PF) 1 % IJ SOLN
INTRAMUSCULAR | Status: AC
  Filled 2017-10-17: qty 2

## 2017-10-17 NOTE — Discharge Instructions (Addendum)
Possible causes of lower abdominal pain include issues with bowels (constipation, inflammation, lactose intolerance), urinary tract (kidney stone, infection) and female parts (ovarian cyst, pelvic infection).  Injection of rocephin and oral dose of zithromax was given at the urgent care.  Urine tests for common pelvic infections are pending; the urgent care will contact you if further treatment is needed.  Results will also be available in MyChart app (instructions to sign up in this handout).  Abdominal xray is pending; the urgent care will contact you if results need further/different treatment.  Prescription for miralax, to help decrease need for straining for BM, was sent to the pharmacy.  Continue with decreased cheese intake to try to decrease crampiness and diarrhea.

## 2017-10-17 NOTE — ED Triage Notes (Signed)
[facility] and lower abdominal pain x2 weeks

## 2017-10-17 NOTE — ED Provider Notes (Signed)
[facility]    CSN: [id number] Arrival date & time: 10/17/17  1844     History   Chief Complaint Chief Complaint  Patient presents with  . Vaginal Bleeding    HPI Kristine Garcia is a 21 y.o. female. presents with 2 wk hx lower abdominal discomfort, worse in the last week, intermittently sharp/crampy.  Soft stools, like diarrhea.  Not unusual for her.  Worried about lactose intolerance, eats a lot of cheese.  Has to strain with BM.  Blood on TP, both after urination and after BM, no blood in bowl or in panties.  No hormone therapy.  LMP ended 11/1.  No dysuria, no frequency.  Nausea but not vomiting.  No fever.  Little bit of mucusy discharge.  HPI  History reviewed. No pertinent past medical history.   History reviewed. No pertinent surgical history.    Home Medications    Prior to Admission medications   Medication Sig Start Date End Date Taking? Authorizing Provider  diphenoxylate-atropine (LOMOTIL) 2.5-0.025 MG tablet Take 2 tablets by mouth 4 (four) times daily as needed for diarrhea or loose stools. 12/10/16   Gilda Crease, MD  doxycycline (VIBRAMYCIN) 100 MG capsule Take 1 capsule (100 mg total) 2 (two) times daily for 7 days by mouth. 10/19/17 10/26/17  Wieters, Hallie C, PA-C  fluconazole (DIFLUCAN) 150 MG tablet Take 1 tablet by mouth once. Repeat 1 tablet in 4 days. 09/08/16   Sudie Grumbling, NP  ibuprofen (ADVIL,MOTRIN) 600 MG tablet Take 1 tablet (600 mg total) by mouth every 6 (six) hours as needed. 05/14/16   Barrett Henle, PA-C  metroNIDAZOLE (FLAGYL) 500 MG tablet Take 1 tablet (500 mg total) by mouth 2 (two) times daily. 09/08/16   Sudie Grumbling, NP  ondansetron (ZOFRAN) 4 MG tablet Take 1 tablet (4 mg total) by mouth every 6 (six) hours. 12/10/16   Gilda Crease, MD  Polyethylene Glycol 3350 (PEG 3350) POWD Take 17 g daily for 14 days by mouth. 10/17/17 10/31/17  Eustace Moore, MD    Family History History reviewed.  No pertinent family history.  Social History Social History   Tobacco Use  . Smoking status: Never Smoker  . Smokeless tobacco: Never Used  Substance Use Topics  . Alcohol use: Yes    Comment: social  . Drug use: Yes    Types: Marijuana    Comment: occ     Allergies   Patient has no known allergies.   Review of Systems Review of Systems  All other systems reviewed and are negative.    Physical Exam Triage Vital Signs ED Triage Vitals [10/17/17 1915]  Enc Vitals Group     BP 131/74     Pulse Rate 96     Resp 16     Temp 99.6 F (37.6 C)     Temp Source Oral     SpO2 100 %     Weight      Height      Pain Score 10     Pain Loc    No data found.  Updated Vital Signs BP 131/74 (BP Location: Left Arm)   Pulse 96   Temp 99.6 F (37.6 C) (Oral)   Resp 16   LMP 10/02/2017 (Exact Date)   SpO2 100%   Physical Exam  Constitutional: She is oriented to person, place, and time. No distress.  HENT:  Head: Atraumatic.  Eyes:  Conjugate gaze observed, no eye redness/discharge  Neck: Neck supple.  Cardiovascular: Normal rate and regular rhythm.  Pulmonary/Chest: No respiratory distress. She has no wheezes. She has no rales.  Abdominal: Soft. She exhibits no distension. There is no rebound and no guarding.  Mild RLQ pain to deep palpation No CVAT  Musculoskeletal: Normal range of motion.  Neurological: She is alert and oriented to person, place, and time.  Skin: Skin is warm and dry.  Nursing note and vitals reviewed.    UC Treatments / Results  Labs Results for orders placed or performed during the hospital encounter of 10/17/17  POCT urinalysis dip (device)  Result Value Ref Range   Glucose, UA NEGATIVE NEGATIVE mg/dL   Bilirubin Urine NEGATIVE NEGATIVE   Ketones, ur NEGATIVE NEGATIVE mg/dL   Specific Gravity, Urine >=1.030 1.005 - 1.030   Hgb urine dipstick MODERATE (A) NEGATIVE   pH 6.0 5.0 - 8.0   Protein, ur NEGATIVE NEGATIVE mg/dL    Urobilinogen, UA 0.2 0.0 - 1.0 mg/dL   Nitrite NEGATIVE NEGATIVE   Leukocytes, UA TRACE (A) NEGATIVE  Pregnancy, urine POC  Result Value Ref Range   Preg Test, Ur NEGATIVE NEGATIVE  I-STAT, chem 8  Result Value Ref Range   Sodium 140 135 - 145 mmol/L   Potassium 3.7 3.5 - 5.1 mmol/L   Chloride 104 101 - 111 mmol/L   BUN 7 6 - 20 mg/dL   Creatinine, Ser 1.610.70 0.44 - 1.00 mg/dL   Glucose, Bld 90 65 - 99 mg/dL   Calcium, Ion 0.961.19 0.451.15 - 1.40 mmol/L   TCO2 24 22 - 32 mmol/L   Hemoglobin 13.3 12.0 - 15.0 g/dL   HCT 40.939.0 81.136.0 - 91.446.0 %  Urine cytology ancillary only  Result Value Ref Range   Chlamydia **POSITIVE** (A)    Neisseria gonorrhea Negative    Trichomonas Negative   Urine cytology ancillary only  Result Value Ref Range   Bacterial vaginitis (A)     **POSITIVE for Atopobium vaginae, POSITIVE for Megasphaera 1, POSITIVE for BVAB2**   Candida vaginitis Negative for Candida Vaginitis Microorganisms     Radiology EXAM: DG ABDOMEN ACUTE W/ 1V CHEST  COMPARISON: None.  FINDINGS: There is no evidence of dilated bowel loops or free intraperitoneal air. No radiopaque calculi or other significant radiographic abnormality is seen. Heart size and mediastinal contours are within normal limits. Both lungs are clear.  IMPRESSION: Negative abdominal radiographs. No acute cardiopulmonary disease.   Electronically Signed By: Ted Mcalpineobrinka Dimitrova M.D. On: 10/17/2017 20:42  Procedures Procedures (including critical care time)  Medications Ordered in UC Medications  cefTRIAXone (ROCEPHIN) injection 250 mg (250 mg Intramuscular Given 10/17/17 2019)  azithromycin (ZITHROMAX) tablet 1,000 mg (1,000 mg Oral Given 10/17/17 2018)    Final Clinical Impressions(s) / UC Diagnoses   Final diagnoses:  Abdominal pain, RLQ   Possible causes of lower abdominal pain include issues with bowels (constipation, inflammation, lactose intolerance), urinary tract (kidney stone, infection)  and female parts (ovarian cyst, pelvic infection).  Injection of rocephin and oral dose of zithromax was given at the urgent care.  Urine tests for common pelvic infections are pending; the urgent care will contact you if further treatment is needed.  Results will also be available in MyChart app (instructions to sign up in this handout).  Abdominal xray is pending; the urgent care will contact you if results need further/different treatment.  Prescription for miralax, to help decrease need for straining for BM, was sent to the pharmacy.  Continue with decreased cheese intake  to try to decrease crampiness and diarrhea.    ED Discharge Orders        Ordered    Polyethylene Glycol 3350 (PEG 3350) POWD  Daily     10/17/17 2025       Controlled Substance Prescriptions [place] Controlled Substance Registry consulted? Not Applicable   Eustace MooreMurray, Fancy Dunkley W, MD 10/19/17 2044

## 2017-10-18 LAB — URINE CYTOLOGY ANCILLARY ONLY
CHLAMYDIA, DNA PROBE: POSITIVE — AB
NEISSERIA GONORRHEA: NEGATIVE
TRICH (WINDOWPATH): NEGATIVE

## 2017-10-19 ENCOUNTER — Ambulatory Visit (HOSPITAL_COMMUNITY)
Admission: EM | Admit: 2017-10-19 | Discharge: 2017-10-19 | Disposition: A | Discharge status: Home / Self Care | Payer: Self-pay | Attending: Emergency Medicine | Admitting: Emergency Medicine

## 2017-10-19 ENCOUNTER — Other Ambulatory Visit: Payer: Self-pay

## 2017-10-19 ENCOUNTER — Encounter (HOSPITAL_COMMUNITY): Payer: Self-pay | Admitting: Emergency Medicine

## 2017-10-19 DIAGNOSIS — A749 Chlamydial infection, unspecified: Secondary | ICD-10-CM

## 2017-10-19 LAB — URINE CYTOLOGY ANCILLARY ONLY: Candida vaginitis: NEGATIVE

## 2017-10-19 MED ORDER — DOXYCYCLINE HYCLATE 100 MG PO CAPS: 100.0000 mg | ORAL_CAPSULE | Freq: Two times a day (BID) | ORAL | 0 refills | Status: AC

## 2017-10-19 MED ORDER — DOXYCYCLINE HYCLATE 100 MG PO CAPS: 100.0000 mg | ORAL_CAPSULE | Freq: Two times a day (BID) | ORAL | 0 refills | Status: DC

## 2017-10-19 NOTE — ED Triage Notes (Signed)
Pt states she was seen and treated for STD here, chlamydia and threw up her antibiotics and wants more to be sure she got the full dose.

## 2017-10-19 NOTE — Discharge Instructions (Signed)
Prescription sent for doxycycline- take 1 tablet twice a day for 7 days.  Your vaginal bleeding and abdominal discomfort should improve as it is cleared.

## 2017-10-19 NOTE — ED Provider Notes (Signed)
[facility]    CSN: [id number] Arrival date & time: 10/19/17  1549     History   Chief Complaint Chief Complaint  Patient presents with  . Exposure to STD    HPI Kristine Garcia is a 21 y.o. female coming in for treatment for chlamydia. She was seen here two days ago for vaginal bleeding and tested positive for chlamydia. She was treated for both gonorrhea and chlamydia.About 15 minutes after taking the azithromycin she vomited the medicine back up before she got home. She stated she did not have anything on her stomach at the time. She states that her abdominal pain has significantly decreased from a 10 to a 2. She does feel her vaginal bleeding has gotten worse. Her LMP ended 11/1.   HPI  History reviewed. No pertinent past medical history.  There are no active problems to display for this patient.   History reviewed. No pertinent surgical history.  OB History    No data available       Home Medications    Prior to Admission medications   Medication Sig Start Date End Date Taking? Authorizing Provider  diphenoxylate-atropine (LOMOTIL) 2.5-0.025 MG tablet Take 2 tablets by mouth 4 (four) times daily as needed for diarrhea or loose stools. 12/10/16   Gilda CreasePollina, Christopher J, MD  doxycycline (VIBRAMYCIN) 100 MG capsule Take 1 capsule (100 mg total) 2 (two) times daily for 7 days by mouth. 10/19/17 10/26/17  Katrinia Straker C, PA-C  fluconazole (DIFLUCAN) 150 MG tablet Take 1 tablet by mouth once. Repeat 1 tablet in 4 days. 09/08/16   Sudie GrumblingAmyot, Ann Berry, NP  ibuprofen (ADVIL,MOTRIN) 600 MG tablet Take 1 tablet (600 mg total) by mouth every 6 (six) hours as needed. 05/14/16   Barrett HenleNadeau, Nicole Elizabeth, PA-C  metroNIDAZOLE (FLAGYL) 500 MG tablet Take 1 tablet (500 mg total) by mouth 2 (two) times daily. 09/08/16   Sudie GrumblingAmyot, Ann Berry, NP  ondansetron (ZOFRAN) 4 MG tablet Take 1 tablet (4 mg total) by mouth every 6 (six) hours. 12/10/16   Gilda CreasePollina, Christopher J, MD  Polyethylene  Glycol 3350 (PEG 3350) POWD Take 17 g daily for 14 days by mouth. 10/17/17 10/31/17  Eustace MooreMurray, Laura W, MD    Family History No family history on file.  Social History Social History   Tobacco Use  . Smoking status: Never Smoker  . Smokeless tobacco: Never Used  Substance Use Topics  . Alcohol use: Yes    Comment: social  . Drug use: Yes    Types: Marijuana    Comment: occ     Allergies   Patient has no known allergies.   Review of Systems Review of Systems  Constitutional: Negative for fever.  Cardiovascular: Negative for chest pain.  Gastrointestinal: Positive for abdominal pain. Negative for nausea and vomiting.  Genitourinary: Positive for pelvic pain and vaginal bleeding.     Physical Exam Triage Vital Signs ED Triage Vitals  Enc Vitals Group     BP 10/19/17 1616 (!) 142/74     Pulse Rate 10/19/17 1616 82     Resp 10/19/17 1616 14     Temp 10/19/17 1616 98.5 F (36.9 C)     Temp src --      SpO2 10/19/17 1616 100 %     Weight --      Height --      Head Circumference --      Peak Flow --      Pain Score 10/19/17 1617 4  Pain Loc --      Pain Edu? --      Excl. in GC? --    No data found.  Updated Vital Signs BP (!) 142/74   Pulse 82   Temp 98.5 F (36.9 C)   Resp 14   LMP 10/02/2017 (Exact Date)   SpO2 100%    Physical Exam  Constitutional: She is oriented to person, place, and time.  Cardiovascular: Normal rate and regular rhythm.  Pulmonary/Chest: Effort normal and breath sounds normal.  Abdominal: Soft. There is tenderness.  Mild tenderness to RLQ  Neurological: She is alert and oriented to person, place, and time.  Skin: Skin is warm and dry.  Psychiatric: She has a normal mood and affect.     UC Treatments / Results  Labs (all labs ordered are listed, but only abnormal results are displayed) Labs Reviewed - No data to display  EKG  EKG Interpretation None       Radiology Dg Abd Acute W/chest  Result Date:  10/17/2017 CLINICAL DATA:  Additional bleeding. EXAM: DG ABDOMEN ACUTE W/ 1V CHEST COMPARISON:  None. FINDINGS: There is no evidence of dilated bowel loops or free intraperitoneal air. No radiopaque calculi or other significant radiographic abnormality is seen. Heart size and mediastinal contours are within normal limits. Both lungs are clear. IMPRESSION: Negative abdominal radiographs.  No acute cardiopulmonary disease. Electronically Signed   By: Ted Mcalpineobrinka  Dimitrova M.D.   On: 10/17/2017 20:42    Procedures Procedures (including critical care time)  Medications Ordered in UC Medications - No data to display   Initial Impression / Assessment and Plan / UC Course  I have reviewed the triage vital signs and the nursing notes.  Pertinent labs & imaging results that were available during my care of the patient were reviewed by me and considered in my medical decision making (see chart for details).    Vaginal bleeding and abdominal pain symptoms concerning for PID. Patient was treated with ceftriaxone on 11/14. Discussed with patient options for re-treatment to do another round of Azithromycin and wait and observe vs. Doxycycline 100mg  for 7 days. She opted for doxycycline option. Prescription sent and she [facility].     Final Clinical Impressions(s) / UC Diagnoses   Final diagnoses:  Chlamydia    ED Discharge Orders        Ordered    doxycycline (VIBRAMYCIN) 100 MG capsule  2 times daily,   Status:  Discontinued     10/19/17 1655    doxycycline (VIBRAMYCIN) 100 MG capsule  2 times daily,   Status:  Discontinued     10/19/17 1658    doxycycline (VIBRAMYCIN) 100 MG capsule  2 times daily     10/19/17 1703       Controlled Substance Prescriptions [place] Controlled Substance Registry consulted? Not Applicable   Lew DawesWieters, Cyler Kappes C, [place]PA-C 10/19/17 1723

## 2018-01-31 ENCOUNTER — Other Ambulatory Visit: Payer: Self-pay

## 2018-01-31 ENCOUNTER — Encounter (HOSPITAL_COMMUNITY): Payer: Self-pay | Admitting: Emergency Medicine

## 2018-01-31 ENCOUNTER — Ambulatory Visit (HOSPITAL_COMMUNITY)
Admission: EM | Admit: 2018-01-31 | Discharge: 2018-01-31 | Disposition: A | Discharge status: Home / Self Care | Payer: Self-pay | Attending: Physician Assistant | Admitting: Physician Assistant

## 2018-01-31 DIAGNOSIS — N898 Other specified noninflammatory disorders of vagina: Secondary | ICD-10-CM | POA: Insufficient documentation

## 2018-01-31 MED ORDER — FLUCONAZOLE 150 MG PO TABS: 150.0000 mg | ORAL_TABLET | Freq: Every day | ORAL | 0 refills | Status: DC

## 2018-01-31 NOTE — ED Provider Notes (Signed)
[facility]    CSN: [id number] Arrival date & time: 01/31/18  1613     History   Chief Complaint Chief Complaint  Patient presents with  . Vaginitis    HPI Kristine Garcia is a 22 y.o. female.   22 year old female comes in for a few day history of vaginal irritation.  States symptoms started after using a bath, thinks it may be yeast infection.  She does endorse some white, cottage cheeselike discharge.  States has irritation around the vaginal area the causes burning on urination.  Denies urinary frequency, hesitancy, urgency, hematuria.  Denies abdominal pain, nausea, vomiting.  Denies fever, chills, night sweats.  LMP 01/17/2018.  Sexually active, one female partner, occasional condom use.       History reviewed. No pertinent past medical history.  There are no active problems to display for this patient.   History reviewed. No pertinent surgical history.  OB History    No data available       Home Medications    Prior to Admission medications   Medication Sig Start Date End Date Taking? Authorizing Provider  diphenoxylate-atropine (LOMOTIL) 2.5-0.025 MG tablet Take 2 tablets by mouth 4 (four) times daily as needed for diarrhea or loose stools. Patient not taking: Reported on 01/31/2018 12/10/16   Gilda CreasePollina, Christopher J, MD  fluconazole (DIFLUCAN) 150 MG tablet Take 1 tablet (150 mg total) by mouth daily. Take second dose 72 hours later if symptoms still persists. 01/31/18   Cathie HoopsYu, Vianca Bracher V, PA-C  ibuprofen (ADVIL,MOTRIN) 600 MG tablet Take 1 tablet (600 mg total) by mouth every 6 (six) hours as needed. Patient not taking: Reported on 01/31/2018 05/14/16   Barrett HenleNadeau, Nicole Elizabeth, PA-C  metroNIDAZOLE (FLAGYL) 500 MG tablet Take 1 tablet (500 mg total) by mouth 2 (two) times daily. Patient not taking: Reported on 01/31/2018 09/08/16   Sudie GrumblingAmyot, Ann Berry, NP  ondansetron (ZOFRAN) 4 MG tablet Take 1 tablet (4 mg total) by mouth every 6 (six) hours. Patient not taking:  Reported on 01/31/2018 12/10/16   Gilda CreasePollina, Christopher J, MD    Family History No family history on file.  Social History Social History   Tobacco Use  . Smoking status: Never Smoker  . Smokeless tobacco: Never Used  Substance Use Topics  . Alcohol use: Yes    Comment: social  . Drug use: Yes    Types: Marijuana    Comment: occ     Allergies   Patient has no known allergies.   Review of Systems Review of Systems  Reason unable to perform ROS: See HPI as above.     Physical Exam Triage Vital Signs ED Triage Vitals  Enc Vitals Group     BP 01/31/18 1706 (!) 92/54     Pulse Rate 01/31/18 1706 77     Resp 01/31/18 1706 18     Temp 01/31/18 1706 98.8 F (37.1 C)     Temp src --      SpO2 01/31/18 1706 100 %     Weight --      Height --      Head Circumference --      Peak Flow --      Pain Score 01/31/18 1707 0     Pain Loc --      Pain Edu? --      Excl. in GC? --    No data found.  Updated Vital Signs BP (!) 92/54   Pulse 77   Temp 98.8 F (  37.1 C)   Resp 18   LMP 01/17/2018   SpO2 100%   Physical Exam  Constitutional: She is oriented to person, place, and time. She appears well-developed and well-nourished. No distress.  HENT:  Head: Normocephalic and atraumatic.  Eyes: Conjunctivae are normal. Pupils are equal, round, and reactive to light.  Cardiovascular: Normal rate, regular rhythm and normal heart sounds. Exam reveals no gallop and no friction rub.  No murmur heard. Pulmonary/Chest: Effort normal and breath sounds normal. She has no wheezes. She has no rales.  Abdominal: Soft. Bowel sounds are normal. She exhibits no mass. There is no tenderness. There is no rebound, no guarding and no CVA tenderness.  Neurological: She is alert and oriented to person, place, and time.  Skin: Skin is warm and dry.  Psychiatric: She has a normal mood and affect. Her behavior is normal. Judgment normal.     UC Treatments / Results  Labs (all labs ordered  are listed, but only abnormal results are displayed) Labs Reviewed  URINE CYTOLOGY ANCILLARY ONLY    EKG  EKG Interpretation None       Radiology No results found.  Procedures Procedures (including critical care time)  Medications Ordered in UC Medications - No data to display   Initial Impression / Assessment and Plan / UC Course  I have reviewed the triage vital signs and the nursing notes.  Pertinent labs & imaging results that were available during my care of the patient were reviewed by me and considered in my medical decision making (see chart for details).    Patient was treated empirically for yeast. Start diflucan as directed. Cytology sent, patient will be contacted with any positive results that require additional treatment. Patient to refrain from sexual activity for the next 7 days. Return precautions given.    Final Clinical Impressions(s) / UC Diagnoses   Final diagnoses:  Vaginal discharge    ED Discharge Orders        Ordered    fluconazole (DIFLUCAN) 150 MG tablet  Daily     01/31/18 1818        Belinda Fisher, PA-C 01/31/18 1825

## 2018-01-31 NOTE — ED Triage Notes (Signed)
Pt states she used a bath bomb last week and states "im sure I have a yeast infection". Pt c/o burning in her vaginal area.

## 2018-01-31 NOTE — Discharge Instructions (Signed)
You were treated empirically for yeast. Diflucan as directed. Cytology sent, you will be contacted with any positive results that requires further treatment. Refrain from sexual activity for the next 7 days. Monitor for any worsening of symptoms, fever, abdominal pain, nausea, vomiting, to follow up for reevaluation. 

## 2018-02-01 LAB — URINE CYTOLOGY ANCILLARY ONLY
CHLAMYDIA, DNA PROBE: NEGATIVE
NEISSERIA GONORRHEA: NEGATIVE
Trichomonas: NEGATIVE

## 2018-02-04 LAB — URINE CYTOLOGY ANCILLARY ONLY: Candida vaginitis: NEGATIVE

## 2018-02-12 ENCOUNTER — Ambulatory Visit (HOSPITAL_COMMUNITY)
Admission: EM | Admit: 2018-02-12 | Discharge: 2018-02-12 | Disposition: A | Discharge status: Home / Self Care | Payer: Self-pay

## 2018-08-12 ENCOUNTER — Ambulatory Visit (HOSPITAL_COMMUNITY)
Admission: EM | Admit: 2018-08-12 | Discharge: 2018-08-12 | Disposition: A | Discharge status: Home / Self Care | Payer: Self-pay | Attending: Family Medicine | Admitting: Family Medicine

## 2018-08-12 ENCOUNTER — Other Ambulatory Visit: Payer: Self-pay

## 2018-08-12 ENCOUNTER — Encounter (HOSPITAL_COMMUNITY): Payer: Self-pay | Admitting: Emergency Medicine

## 2018-08-12 DIAGNOSIS — Z79899 Other long term (current) drug therapy: Secondary | ICD-10-CM | POA: Insufficient documentation

## 2018-08-12 DIAGNOSIS — M940 Chondrocostal junction syndrome [Tietze]: Secondary | ICD-10-CM

## 2018-08-12 LAB — POCT URINALYSIS DIP (DEVICE)
Bilirubin Urine: NEGATIVE
GLUCOSE, UA: NEGATIVE mg/dL
Hgb urine dipstick: NEGATIVE
KETONES UR: NEGATIVE mg/dL
Leukocytes, UA: NEGATIVE
Nitrite: NEGATIVE
PROTEIN: NEGATIVE mg/dL
Specific Gravity, Urine: >=1.03 (ref 1.005–1.030)
UROBILINOGEN UA: 0.2 mg/dL (ref 0.0–1.0)
pH: 6.5 (ref 5.0–8.0)

## 2018-08-12 LAB — POCT PREGNANCY, URINE: PREG TEST UR: NEGATIVE

## 2018-08-12 NOTE — ED Provider Notes (Signed)
[facility]    CSN: [id number] Arrival date & time: 08/12/18  1750     History   Chief Complaint Chief Complaint  Patient presents with  . Abdominal Pain    HPI Kristine Garcia is a 22 y.o. female.   She is presenting with right flank pain.  Pain started today.  The pain is worse upon breathing.  She denies any association with food.  She denies any blood in her urine.  She is unclear if she has had a chance of being pregnant.  She has had the same boyfriend but has had a history of STIs.  She denies any change in her bowel movements.  No prior history of any abdominal surgery.  Denies any fevers.  Has not taken anything for the pain.  The pain is been getting worse of the course of the day.  It is moderate to severe in severity.  Had no trauma to the her abdomen.  HPI  History reviewed. No pertinent past medical history.  There are no active problems to display for this patient.   History reviewed. No pertinent surgical history.  OB History   None      Home Medications    Prior to Admission medications   Medication Sig Start Date End Date Taking? Authorizing Provider  diphenoxylate-atropine (LOMOTIL) 2.5-0.025 MG tablet Take 2 tablets by mouth 4 (four) times daily as needed for diarrhea or loose stools. Patient not taking: Reported on 01/31/2018 12/10/16   Gilda Crease, MD  fluconazole (DIFLUCAN) 150 MG tablet Take 1 tablet (150 mg total) by mouth daily. Take second dose 72 hours later if symptoms still persists. 01/31/18   Cathie Hoops, Amy V, PA-C  ibuprofen (ADVIL,MOTRIN) 600 MG tablet Take 1 tablet (600 mg total) by mouth every 6 (six) hours as needed. Patient not taking: Reported on 01/31/2018 05/14/16   Barrett Henle, PA-C  metroNIDAZOLE (FLAGYL) 500 MG tablet Take 1 tablet (500 mg total) by mouth 2 (two) times daily. Patient not taking: Reported on 01/31/2018 09/08/16   Sudie Grumbling, NP  ondansetron (ZOFRAN) 4 MG tablet Take 1 tablet (4 mg total) by  mouth every 6 (six) hours. Patient not taking: Reported on 01/31/2018 12/10/16   Gilda Crease, MD    Family History Family History  Problem Relation Age of Onset  . Hypertension Mother   . Hypertension Father     Social History Social History   Tobacco Use  . Smoking status: Never Smoker  . Smokeless tobacco: Never Used  Substance Use Topics  . Alcohol use: Yes    Comment: social  . Drug use: Yes    Types: Marijuana    Comment: occ     Allergies   Patient has no known allergies.   Review of Systems Review of Systems  Constitutional: Negative for fever.  HENT: Negative for congestion.   Respiratory: Negative for shortness of breath.   Cardiovascular: Negative for chest pain.  Gastrointestinal: Negative for abdominal pain.  Genitourinary: Positive for flank pain.  Musculoskeletal: Negative for gait problem.  Skin: Negative for color change.  Neurological: Negative for weakness.  Hematological: Negative for adenopathy.  Psychiatric/Behavioral: Negative for agitation.     Physical Exam Triage Vital Signs ED Triage Vitals [08/12/18 1847]  Enc Vitals Group     BP      Pulse      Resp      Temp      Temp src  SpO2      Weight      Height      Head Circumference      Peak Flow      Pain Score 7     Pain Loc      Pain Edu?      Excl. in GC?    No data found.  Updated Vital Signs BP 117/71 (BP Location: Right Arm) Comment (BP Location): large cuff  Pulse 66   Temp 99.1 F (37.3 C) (Oral)   Resp 20   LMP 07/29/2018   SpO2 100%   Visual Acuity Right Eye Distance:   Left Eye Distance:   Bilateral Distance:    Right Eye Near:   Left Eye Near:    Bilateral Near:     Physical Exam Gen: NAD, alert, cooperative with exam, well-appearing ENT: normal lips, normal nasal mucosa,  Eye: normal EOM, normal conjunctiva and lids CV:  no edema, +2 pedal pulses, S1-S2, regular rate and rhythm Resp: no accessory muscle use, non-labored, clear to  auscultation bilaterally GI: no masses or tenderness, no hernia  Skin: no rashes, no areas of induration  Neuro: normal tone, normal sensation to touch Psych:  normal insight, alert and oriented MSK: normal gait, normal strength, mid axillary tenderness on the right side roughly around rib 7 through 9.  No step-offs appreciated no crepitus appreciated.   UC Treatments / Results  Labs (all labs ordered are listed, but only abnormal results are displayed) Labs Reviewed  POCT URINALYSIS DIP (DEVICE)  POCT PREGNANCY, URINE  CERVICOVAGINAL ANCILLARY ONLY    EKG None  Radiology No results found.  Procedures Procedures (including critical care time)  Medications Ordered in UC Medications - No data to display  Initial Impression / Assessment and Plan / UC Course  I have reviewed the triage vital signs and the nursing notes.  Pertinent labs & imaging results that were available during my care of the patient were reviewed by me and considered in my medical decision making (see chart for details).     She is is presenting with this mid axillary flank pain that was nontraumatic and presented today.  Less likely for her symptoms to be associated with cholecystitis.  Urine was unrevealing for suggestion of a kidney stone and upreg negative.  Cytology is pending.  Has had one partner so less likely for STI's but has had a history of STIs.  Likely symptoms are MSK in origin. Counseled on supportive care. Would treat for infection if cytology returns positive.     Final Clinical Impressions(s) / UC Diagnoses   Final diagnoses:  Costochondritis     Discharge Instructions     You will get a call about your labs from today  Please try an anti-inflammatory for the pain  Please follow up if your pain is worse.      ED Prescriptions    None     Controlled Substance Prescriptions [place] Controlled Substance Registry consulted? Not Applicable   Myra Rude, MD 08/13/18  1309

## 2018-08-12 NOTE — Discharge Instructions (Addendum)
You will get a call about your labs from today  Please try an anti-inflammatory for the pain  Please follow up if your pain is worse.

## 2018-08-12 NOTE — ED Triage Notes (Signed)
Patient has right side abdominal pain that started today.  Patient says breathing or coughing makes pain worse.  .  Patient says she has clear vaginal discharge. Denies pain with urination

## 2018-08-13 ENCOUNTER — Telehealth (HOSPITAL_COMMUNITY): Payer: Self-pay

## 2018-08-13 LAB — CERVICOVAGINAL ANCILLARY ONLY
Chlamydia: POSITIVE — AB
Neisseria Gonorrhea: NEGATIVE
Trichomonas: NEGATIVE

## 2018-08-13 MED ORDER — AZITHROMYCIN 250 MG PO TABS: 1000.0000 mg | ORAL_TABLET | Freq: Once | ORAL | 0 refills | Status: AC

## 2018-08-13 NOTE — Telephone Encounter (Signed)
Chlamydia is positive.  Rx po zithromax 1g #1 dose no refills was sent to the pharmacy of record.  Need to educate to please refrain from sexual intercourse for 7 days to give the medicine time to work, sexual partners need to be notified and tested/treated.  Condoms may reduce risk of reinfection.  Recheck or followup with PCP for further evaluation if symptoms are not improving.   [facility] notified. Message sent to mychart.

## 2018-08-15 ENCOUNTER — Telehealth (HOSPITAL_COMMUNITY): Payer: Self-pay

## 2018-08-15 NOTE — Telephone Encounter (Signed)
Attempted to reach patient x 2 

## 2018-08-15 NOTE — Telephone Encounter (Signed)
Pt aware of results and new rx. 

## 2018-08-16 ENCOUNTER — Ambulatory Visit (HOSPITAL_COMMUNITY)
Admission: EM | Admit: 2018-08-16 | Discharge: 2018-08-16 | Disposition: A | Discharge status: Home / Self Care | Payer: Self-pay | Attending: Nurse Practitioner | Admitting: Nurse Practitioner

## 2018-08-16 ENCOUNTER — Encounter (HOSPITAL_COMMUNITY): Payer: Self-pay

## 2018-08-16 DIAGNOSIS — R109 Unspecified abdominal pain: Secondary | ICD-10-CM

## 2018-08-16 MED ORDER — DICLOFENAC SODIUM 50 MG PO TBEC: 50.0000 mg | DELAYED_RELEASE_TABLET | Freq: Three times a day (TID) | ORAL | 0 refills | Status: DC | PRN

## 2018-08-16 MED ORDER — METHYLPREDNISOLONE 4 MG PO TBPK: ORAL_TABLET | ORAL | 0 refills | Status: DC

## 2018-08-16 NOTE — ED Provider Notes (Signed)
[facility]    CSN: [id number] Arrival date & time: 08/16/18  1215     History   Chief Complaint Chief Complaint  Patient presents with  . Abdominal Pain    HPI Kristine Garcia is a 22 y.o. female.   Subjective:   Kristine Garcia is a 22 y.o. female who presents for evaluation of right flank pain. The pain is described as sharp and shooting, and is 6/10 in intensity. Pain is located in the right flank area with radiation to right shoulder. Onset was 1 week ago. Symptoms have been unchanged since. Aggravating factors: eating, movement and sitting up.  Alleviating factors: none. Associated symptoms: nausea. The patient denies anorexia, constipation, diarrhea, dysuria, fever, urinary frequency, vaginal discharge, headache, hematuria, melena, vomiting, chest pain or abdominal pain. Notably, the patient was evaluated here on 08/12/18 for the same. Urinalysis and urine pregnancy was negative. Urine cytology positive for chlamydia. She has completed Zithromax. Pain was thought to be musculoskeletal in etiology. Patient advised to take OTC analgesics and follow-up if no improvement/worsening of symptoms.   The following portions of the patient's history were reviewed and updated as appropriate: allergies, current medications, past family history, past medical history, past social history, past surgical history and problem list.          History reviewed. No pertinent past medical history.  There are no active problems to display for this patient.   History reviewed. No pertinent surgical history.  OB History   None      Home Medications    Prior to Admission medications   Medication Sig Start Date End Date Taking? Authorizing Provider  diclofenac (VOLTAREN) 50 MG EC tablet Take 1 tablet (50 mg total) by mouth 3 (three) times daily as needed. 08/16/18   Lurline IdolMurrill, Nester Bachus, FNP  methylPREDNISolone (MEDROL DOSEPAK) 4 MG TBPK tablet As directed 08/16/18   Lurline IdolMurrill, Javonta Gronau, FNP     Family History Family History  Problem Relation Age of Onset  . Hypertension Mother   . Hypertension Father     Social History Social History   Tobacco Use  . Smoking status: Never Smoker  . Smokeless tobacco: Never Used  Substance Use Topics  . Alcohol use: Yes    Comment: social  . Drug use: Yes    Types: Marijuana    Comment: occ     Allergies   Patient has no known allergies.   Review of Systems Review of Systems  Constitutional: Negative for fever.  Gastrointestinal: Positive for nausea. Negative for abdominal pain, blood in stool, constipation, diarrhea and vomiting.  Genitourinary: Positive for flank pain. Negative for dysuria, frequency, hematuria, pelvic pain, urgency, vaginal discharge and vaginal pain.  Musculoskeletal: Negative for back pain.  All other systems reviewed and are negative.    Physical Exam Triage Vital Signs ED Triage Vitals  Enc Vitals Group     BP 08/16/18 1252 135/85     Pulse Rate 08/16/18 1251 74     Resp 08/16/18 1251 18     Temp 08/16/18 1251 98.5 F (36.9 C)     Temp src --      SpO2 08/16/18 1251 100 %     Weight --      Height --      Head Circumference --      Peak Flow --      Pain Score 08/16/18 1250 7     Pain Loc --      Pain Edu? --  Excl. in GC? --    No data found.  Updated Vital Signs BP 135/85   Pulse 74   Temp 98.5 F (36.9 C)   Resp 18   LMP 07/29/2018   SpO2 100%   Visual Acuity Right Eye Distance:   Left Eye Distance:   Bilateral Distance:    Right Eye Near:   Left Eye Near:    Bilateral Near:     Physical Exam  Constitutional: She is oriented to person, place, and time. She appears well-developed and well-nourished.  Neck: Neck supple.  Cardiovascular: Normal rate.  Pulmonary/Chest: Effort normal and breath sounds normal.  Abdominal: Soft. Normal appearance and bowel sounds are normal. She exhibits no distension. There is no CVA tenderness.  Musculoskeletal: Normal range of  motion.  Neurological: She is alert and oriented to person, place, and time. No cranial nerve deficit.  Skin: Skin is warm and dry.  Psychiatric: She has a normal mood and affect.     UC Treatments / Results  Labs (all labs ordered are listed, but only abnormal results are displayed) Labs Reviewed - No data to display  EKG None  Radiology No results found.  Procedures Procedures (including critical care time)  Medications Ordered in UC Medications - No data to display  Initial Impression / Assessment and Plan / UC Course  I have reviewed the triage vital signs and the nursing notes.  Pertinent labs & imaging results that were available during my care of the patient were reviewed by me and considered in my medical decision making (see chart for details).     22 year old female presenting with a one-week history of right flank pain with radiation to the right shoulder with associated nausea.  Patient was evaluated here for the same on 08/12/2018.  Urinalysis and urine pregnancy done at that time was negative.  Otology positive for chlamydia.  She has completed Zithromax and is also taken Tylenol without any change in her symptoms.  Physical exam is unremarkable.  No CVA tenderness.  No vertebral tenderness.  No chest wall tenderness.  No abdominal tenderness.  Patient is non-toxic appearing.  Vital signs stable.  Supportive care advised at this time.  Medrol dose pack as directed. Diclofenac as needed. Follow-up with Dr. Allyne Gee at [facility] if no improvement in symptoms and to establish primary care.   Today's evaluation has revealed no signs of a dangerous process. Discussed diagnosis with patient. Patient aware of their diagnosis, possible red flag symptoms to watch out for and need for close follow up. Patient understands verbal and written discharge instructions. Patient comfortable with plan and disposition.  Patient has a clear mental status at this time, good  insight into illness (after discussion and teaching) and has clear judgment to make decisions regarding their care.  Documentation was completed with the aid of voice recognition software. Transcription may contain typographical errors. Final Clinical Impressions(s) / UC Diagnoses   Final diagnoses:  Right flank pain     Discharge Instructions     Take medications as prescribed. Follow-up with Dr. Allyne Gee if no improvement in symptoms and to establish primary care. Go to ED immediately for pain unrelieved by medications, vomiting, blood in urine or any concerning symptoms    ED Prescriptions    Medication Sig Dispense Auth. Provider   diclofenac (VOLTAREN) 50 MG EC tablet Take 1 tablet (50 mg total) by mouth 3 (three) times daily as needed. 21 tablet Lurline Idol, FNP   methylPREDNISolone (MEDROL DOSEPAK)  4 MG TBPK tablet As directed 21 tablet Lurline Idol, FNP     Controlled Substance Prescriptions [place] Controlled Substance Registry consulted? Not Applicable   Lurline Idol, FNP 08/16/18 1406

## 2018-08-16 NOTE — ED Triage Notes (Signed)
Pt presents with complaints of ruq abdominal pain that is sharp in nature and radiates up to her shoulder. Worse after a meal.

## 2018-08-16 NOTE — Discharge Instructions (Signed)
Take medications as prescribed. Follow-up with Dr. Allyne GeeSanders if no improvement in symptoms and to establish primary care. Go to ED immediately for pain unrelieved by medications, vomiting, blood in urine or any concerning symptoms

## 2018-09-13 ENCOUNTER — Ambulatory Visit (HOSPITAL_COMMUNITY)
Admission: EM | Admit: 2018-09-13 | Discharge: 2018-09-13 | Disposition: A | Discharge status: Home / Self Care | Payer: Self-pay | Attending: Family Medicine | Admitting: Family Medicine

## 2018-09-13 ENCOUNTER — Encounter (HOSPITAL_COMMUNITY): Payer: Self-pay | Admitting: Emergency Medicine

## 2018-09-13 DIAGNOSIS — Z113 Encounter for screening for infections with a predominantly sexual mode of transmission: Secondary | ICD-10-CM | POA: Insufficient documentation

## 2018-09-13 DIAGNOSIS — N898 Other specified noninflammatory disorders of vagina: Secondary | ICD-10-CM | POA: Insufficient documentation

## 2018-09-13 DIAGNOSIS — Z79899 Other long term (current) drug therapy: Secondary | ICD-10-CM | POA: Insufficient documentation

## 2018-09-13 DIAGNOSIS — Z202 Contact with and (suspected) exposure to infections with a predominantly sexual mode of transmission: Secondary | ICD-10-CM | POA: Insufficient documentation

## 2018-09-13 MED ORDER — FLUCONAZOLE 150 MG PO TABS: 150.0000 mg | ORAL_TABLET | Freq: Every day | ORAL | 0 refills | Status: DC

## 2018-09-13 NOTE — ED Provider Notes (Signed)
[facility]    CSN: [id number] Arrival date & time: 09/13/18  1916     History   Chief Complaint Chief Complaint  Patient presents with  . Exposure to STD    HPI Kristine Garcia is a 22 y.o. female.   22 year old female comes in for STD check.  She was diagnosed with chlamydia a month ago, and would like recheck to make sure it is cleared.  Started having few day history of clumpy white discharge with mild vaginal itching.  Denies urinary symptoms such as frequency, dysuria, hematuria.  Denies fever, chills, night sweats.  Denies abdominal pain, nausea, vomiting.  States that she has been sexually active since treatment, but partner was tested negative for STD     History reviewed. No pertinent past medical history.  There are no active problems to display for this patient.   History reviewed. No pertinent surgical history.  OB History   None      Home Medications    Prior to Admission medications   Medication Sig Start Date End Date Taking? Authorizing Provider  diclofenac (VOLTAREN) 50 MG EC tablet Take 1 tablet (50 mg total) by mouth 3 (three) times daily as needed. 08/16/18   Lurline Idol, FNP  fluconazole (DIFLUCAN) 150 MG tablet Take 1 tablet (150 mg total) by mouth daily. Take second dose 72 hours later if symptoms still persists. 09/13/18   Belinda Fisher, PA-C  methylPREDNISolone (MEDROL DOSEPAK) 4 MG TBPK tablet As directed 08/16/18   Lurline Idol, FNP    Family History Family History  Problem Relation Age of Onset  . Hypertension Mother   . Hypertension Father     Social History Social History   Tobacco Use  . Smoking status: Never Smoker  . Smokeless tobacco: Never Used  Substance Use Topics  . Alcohol use: Yes    Comment: social  . Drug use: Yes    Types: Marijuana    Comment: occ     Allergies   Patient has no known allergies.   Review of Systems Review of Systems  Reason unable to perform ROS: See HPI as above.      Physical Exam Triage Vital Signs ED Triage Vitals [09/13/18 1934]  Enc Vitals Group     BP 128/78     Pulse Rate 71     Resp 18     Temp 98.1 F (36.7 C)     Temp Source Oral     SpO2 100 %     Weight      Height      Head Circumference      Peak Flow      Pain Score 0     Pain Loc      Pain Edu?      Excl. in GC?    No data found.  Updated Vital Signs BP 128/78 (BP Location: Left Arm)   Pulse 71   Temp 98.1 F (36.7 C) (Oral)   Resp 18   SpO2 100%   Physical Exam  Constitutional: She is oriented to person, place, and time. She appears well-developed and well-nourished. No distress.  HENT:  Head: Normocephalic and atraumatic.  Eyes: Pupils are equal, round, and reactive to light. Conjunctivae are normal.  Cardiovascular: Normal rate, regular rhythm and normal heart sounds. Exam reveals no gallop and no friction rub.  No murmur heard. Pulmonary/Chest: Effort normal and breath sounds normal. She has no wheezes. She has no rales.  Abdominal: Soft. Bowel  sounds are normal. She exhibits no mass. There is no tenderness. There is no rebound, no guarding and no CVA tenderness.  Neurological: She is alert and oriented to person, place, and time.  Skin: Skin is warm and dry.  Psychiatric: She has a normal mood and affect. Her behavior is normal. Judgment normal.     UC Treatments / Results  Labs (all labs ordered are listed, but only abnormal results are displayed) Labs Reviewed  CERVICOVAGINAL ANCILLARY ONLY    EKG None  Radiology No results found.  Procedures Procedures (including critical care time)  Medications Ordered in UC Medications - No data to display  Initial Impression / Assessment and Plan / UC Course  I have reviewed the triage vital signs and the nursing notes.  Pertinent labs & imaging results that were available during my care of the patient were reviewed by me and considered in my medical decision making (see chart for details).     Patient was treated empirically for yeast, diflucan as directed. Cytology sent, patient will be contacted with any positive results that require additional treatment. Patient to refrain from sexual activity for the next 7 days. Return precautions given.   Final Clinical Impressions(s) / UC Diagnoses   Final diagnoses:  Vaginal discharge  Screen for STD (sexually transmitted disease)    ED Prescriptions    Medication Sig Dispense Auth. Provider   fluconazole (DIFLUCAN) 150 MG tablet Take 1 tablet (150 mg total) by mouth daily. Take second dose 72 hours later if symptoms still persists. 2 tablet Threasa Alpha, [place] 09/13/18 2035

## 2018-09-13 NOTE — ED Triage Notes (Signed)
Pt here for STD screening. 

## 2018-09-13 NOTE — Discharge Instructions (Signed)
You were treated empirically for yeast. Diflucan as directed. Cytology sent, you will be contacted with any positive results that requires further treatment. Refrain from sexual activity and alcohol use for the next 7 days. Monitor for any worsening of symptoms, fever, abdominal pain, nausea, vomiting, to follow up for reevaluation. ° °

## 2018-09-17 LAB — CERVICOVAGINAL ANCILLARY ONLY
CHLAMYDIA, DNA PROBE: NEGATIVE
Neisseria Gonorrhea: NEGATIVE
Trichomonas: NEGATIVE

## 2018-11-13 ENCOUNTER — Encounter (HOSPITAL_COMMUNITY): Payer: Self-pay

## 2018-11-13 ENCOUNTER — Ambulatory Visit (HOSPITAL_COMMUNITY)
Admission: EM | Admit: 2018-11-13 | Discharge: 2018-11-13 | Disposition: A | Discharge status: Home / Self Care | Payer: Self-pay | Attending: Family Medicine | Admitting: Family Medicine

## 2018-11-13 DIAGNOSIS — Z8249 Family history of ischemic heart disease and other diseases of the circulatory system: Secondary | ICD-10-CM | POA: Insufficient documentation

## 2018-11-13 DIAGNOSIS — Z113 Encounter for screening for infections with a predominantly sexual mode of transmission: Secondary | ICD-10-CM

## 2018-11-13 DIAGNOSIS — Z202 Contact with and (suspected) exposure to infections with a predominantly sexual mode of transmission: Secondary | ICD-10-CM | POA: Insufficient documentation

## 2018-11-13 MED ORDER — CEFTRIAXONE SODIUM 250 MG IJ SOLR
INTRAMUSCULAR | Status: AC
  Filled 2018-11-13: qty 250

## 2018-11-13 MED ORDER — AZITHROMYCIN 250 MG PO TABS
ORAL_TABLET | ORAL | Status: AC
  Filled 2018-11-13: qty 4

## 2018-11-13 MED ORDER — AZITHROMYCIN 250 MG PO TABS
1000.0000 mg | ORAL_TABLET | Freq: Once | ORAL | Status: AC
  Administered 2018-11-13: 1000 mg via ORAL

## 2018-11-13 MED ORDER — LIDOCAINE HCL (PF) 1 % IJ SOLN
INTRAMUSCULAR | Status: AC
  Filled 2018-11-13: qty 2

## 2018-11-13 MED ORDER — CEFTRIAXONE SODIUM 250 MG IJ SOLR
250.0000 mg | Freq: Once | INTRAMUSCULAR | Status: AC
  Administered 2018-11-13: 250 mg via INTRAMUSCULAR

## 2018-11-13 NOTE — ED Provider Notes (Signed)
[facility]   [id number] 11/13/18 Arrival Time: [id number]  ASSESSMENT & PLAN:  1. STD exposure   No signs of PID.   Discharge Instructions     You have been given the following medications today for treatment of suspected gonorrhea and/or chlamydia:  cefTRIAXone (ROCEPHIN) injection 250 mg azithromycin (ZITHROMAX) tablet 1,000 mg  Even though we have treated you today, we have sent testing for sexually transmitted infections. We will notify you of any positive results once they are received. If required, we will prescribe any medications you might need.  Please refrain from all sexual activity for at least the next seven days.    Pending: Labs Reviewed  CERVICOVAGINAL ANCILLARY ONLY    Will notify of any positive results. Instructed to refrain from sexual activity for at least seven days.  Reviewed expectations re: course of current medical issues. Questions answered. Outlined signs and symptoms indicating need for more acute intervention. Patient verbalized understanding. After Visit Summary given.   SUBJECTIVE:  Kristine Garcia is a 22 y.o. female who reports possible exposure to a STI. Partner informed her of + testing results. She requests empiric treatment and testing. No symptoms, including vaginal discharge or urinary symptoms. Afebrile. No abdominal or pelvic pain. No flank pain. No n/v. No rashes or lesions. Sexually active with single female partner.  Patient's last menstrual period was 11/11/2018.  ROS: As per HPI.  OBJECTIVE:  Vitals:   11/13/18 0856  BP: 140/70  Pulse: 86  Resp: 20  Temp: 98.6 F (37 C)  TempSrc: Oral  SpO2: 98%     General appearance: alert, cooperative, appears stated age and no distress Throat: lips, mucosa, and tongue normal; teeth and gums normal Cv: RRR Lungs: CTAB Back: no CVA tenderness; FROM at waist Abdomen: soft, non-tender GU: deferred Skin: warm and dry Psychological: alert and cooperative; normal mood and  affect.   Labs Reviewed  CERVICOVAGINAL ANCILLARY ONLY    No Known Allergies   Family History  Problem Relation Age of Onset  . Hypertension Mother   . Hypertension Father    Social History   Socioeconomic History  . Marital status: Single    Spouse name: Not on file  . Number of children: Not on file  . Years of education: Not on file  . Highest education level: Not on file  Occupational History  . Not on file  Social Needs  . Financial resource strain: Not on file  . Food insecurity:    Worry: Not on file    Inability: Not on file  . Transportation needs:    Medical: Not on file    Non-medical: Not on file  Tobacco Use  . Smoking status: Never Smoker  . Smokeless tobacco: Never Used  Substance and Sexual Activity  . Alcohol use: Yes    Comment: social  . Drug use: Yes    Types: Marijuana    Comment: occ  . Sexual activity: Not on file  Lifestyle  . Physical activity:    Days per week: Not on file    Minutes per session: Not on file  . Stress: Not on file  Relationships  . Social connections:    Talks on phone: Not on file    Gets together: Not on file    Attends religious service: Not on file    Active member of club or organization: Not on file    Attends meetings of clubs or organizations: Not on file    Relationship status: Not  on file  . Intimate partner violence:    Fear of current or ex partner: Not on file    Emotionally abused: Not on file    Physically abused: Not on file    Forced sexual activity: Not on file  Other Topics Concern  . Not on file  Social History Narrative  . Not on file          Mardella Layman, MD 11/13/18 1046

## 2018-11-13 NOTE — Discharge Instructions (Addendum)

## 2018-11-13 NOTE — ED Triage Notes (Signed)
Pt presents for STD Testing after exposure from partner.

## 2018-11-13 NOTE — ED Notes (Signed)
Patient able to ambulate independently  

## 2018-11-15 LAB — CERVICOVAGINAL ANCILLARY ONLY
Bacterial vaginitis: NEGATIVE
CANDIDA VAGINITIS: POSITIVE — AB
CHLAMYDIA, DNA PROBE: NEGATIVE
NEISSERIA GONORRHEA: NEGATIVE
Trichomonas: NEGATIVE

## 2018-11-16 ENCOUNTER — Telehealth (HOSPITAL_COMMUNITY): Payer: Self-pay | Admitting: Emergency Medicine

## 2018-11-16 MED ORDER — FLUCONAZOLE 150 MG PO TABS: 150.0000 mg | ORAL_TABLET | Freq: Once | ORAL | 0 refills | Status: AC

## 2018-11-16 NOTE — Telephone Encounter (Signed)
Pt called back about results. All questions answered.  

## 2018-11-16 NOTE — Telephone Encounter (Signed)
Test for candida (yeast) was positive.  Prescription for fluconazole 150mg  po now, repeat dose in 3d if needed, #2 no refills, sent to the pharmacy of record.  Recheck or followup with PCP for further evaluation if symptoms are not improving.    Attempted to reach patient. No answer at this time. No voicemail.

## 2019-07-24 ENCOUNTER — Ambulatory Visit: Payer: Managed Care, Other (non HMO)

## 2019-07-24 ENCOUNTER — Other Ambulatory Visit: Payer: Self-pay

## 2019-07-24 ENCOUNTER — Ambulatory Visit: Payer: Managed Care, Other (non HMO) | Attending: Family Medicine | Admitting: Physician Assistant

## 2019-07-24 DIAGNOSIS — Z832 Family history of diseases of the blood and blood-forming organs and certain disorders involving the immune mechanism: Secondary | ICD-10-CM

## 2019-07-24 DIAGNOSIS — M79604 Pain in right leg: Secondary | ICD-10-CM | POA: Diagnosis not present

## 2019-07-24 DIAGNOSIS — M79605 Pain in left leg: Secondary | ICD-10-CM | POA: Diagnosis not present

## 2019-07-24 DIAGNOSIS — Z131 Encounter for screening for diabetes mellitus: Secondary | ICD-10-CM | POA: Diagnosis not present

## 2019-07-24 NOTE — Progress Notes (Signed)
Virtual Visit via Telephone Note  I connected with [name] on 07/24/19 at 11:10 AM EDT by telephone and verified that I am speaking with the correct person using two identifiers.   I discussed the limitations, risks, security and privacy concerns of performing an evaluation and management service by telephone and the availability of in person appointments. I also discussed with the patient that there may be a patient responsible charge related to this service. The patient expressed understanding and agreed to proceed.  Patient location:  home My Location:  [place] office Persons on the call:  Me and the patient.     History of Present Illness:  Patient c/o "legs giving out" for many years(since she was young).  She was told that if theses pains continued into adulthood, she should have labs checked.  She says when it occurs, her legs physically won't move.  It causes some pain.  She denies swelling or redness.  No SOB or CP.    FH:  Diabetes and SCA    Observations/Objective:  A&Ox3   Assessment and Plan:   1. Pain in both lower extremities-longstanding - Basic metabolic panel - Vitamin D, 25-hydroxy - TSH - CBC with Differential/Platelet; Future  2. Screening for diabetes mellitus - Basic metabolic panel - Hemoglobin A1c  3. FH: sickle cell anemia - CBC with Differential/Platelet; Future    Follow Up Instructions: Assign PCP 2-3 months   I discussed the assessment and treatment plan with the patient. The patient was provided an opportunity to ask questions and all were answered. The patient agreed with the plan and demonstrated an understanding of the instructions.   The patient was advised to call back or seek an in-person evaluation if the symptoms worsen or if the condition fails to improve as anticipated.  I provided 11 minutes of non-face-to-face time during this encounter.   Freeman Caldron, PA-C  Patient ID: Kristine Garcia, female   DOB: 12-Apr-1996, 23 y.o.   MRN:  [id number]

## 2019-07-24 NOTE — Addendum Note (Signed)
Addended by: Jacqualine Mau on: 07/24/2019 11:23 AM   Modules accepted: Orders

## 2019-07-24 NOTE — Progress Notes (Signed)
Patient verified DOB Patient has not eaten today. Patient has not taken medication. Patient complains of pain in the legs intermittently with numbness and tingling.

## 2019-07-25 LAB — CBC WITH DIFFERENTIAL/PLATELET
Basophils Absolute: 0 10*3/uL (ref 0.0–0.2)
Basos: 1 %
EOS (ABSOLUTE): 0 10*3/uL (ref 0.0–0.4)
Eos: 0 %
Hematocrit: 40 % (ref 34.0–46.6)
Hemoglobin: 13.2 g/dL (ref 11.1–15.9)
Immature Grans (Abs): 0 10*3/uL (ref 0.0–0.1)
Immature Granulocytes: 0 %
Lymphocytes Absolute: 2.6 10*3/uL (ref 0.7–3.1)
Lymphs: 41 %
MCH: 29.9 pg (ref 26.6–33.0)
MCHC: 33 g/dL (ref 31.5–35.7)
MCV: 91 fL (ref 79–97)
Monocytes Absolute: 0.4 10*3/uL (ref 0.1–0.9)
Monocytes: 7 %
Neutrophils Absolute: 3.3 10*3/uL (ref 1.4–7.0)
Neutrophils: 51 %
Platelets: 254 10*3/uL (ref 150–450)
RBC: 4.42 x10E6/uL (ref 3.77–5.28)
RDW: 13 % (ref 11.7–15.4)
WBC: 6.4 10*3/uL (ref 3.4–10.8)

## 2019-07-25 LAB — BASIC METABOLIC PANEL
BUN/Creatinine Ratio: 12 (ref 9–23)
BUN: 11 mg/dL (ref 6–20)
CO2: 22 mmol/L (ref 20–29)
Calcium: 10 mg/dL (ref 8.7–10.2)
Chloride: 105 mmol/L (ref 96–106)
Creatinine, Ser: 0.94 mg/dL (ref 0.57–1.00)
GFR calc Af Amer: 98 mL/min/{1.73_m2} (ref >59)
GFR calc non Af Amer: 85 mL/min/{1.73_m2} (ref >59)
Glucose: 97 mg/dL (ref 65–99)
Potassium: 4.5 mmol/L (ref 3.5–5.2)
Sodium: 140 mmol/L (ref 134–144)

## 2019-07-25 LAB — TSH: TSH: 0.713 u[IU]/mL (ref 0.450–4.500)

## 2019-07-25 LAB — HEMOGLOBIN A1C
Est. average glucose Bld gHb Est-mCnc: 105 mg/dL
Hgb A1c MFr Bld: 5.3 % (ref 4.8–5.6)

## 2019-07-25 LAB — VITAMIN D 25 HYDROXY (VIT D DEFICIENCY, FRACTURES): Vit D, 25-Hydroxy: 10.4 ng/mL — ABNORMAL LOW (ref 30.0–100.0)

## 2019-07-28 ENCOUNTER — Other Ambulatory Visit: Payer: Self-pay | Admitting: Physician Assistant

## 2019-07-28 MED ORDER — VITAMIN D (ERGOCALCIFEROL) 1.25 MG (50000 UNIT) PO CAPS: 50000.0000 [IU] | ORAL_CAPSULE | ORAL | 0 refills | Status: DC

## 2019-09-24 ENCOUNTER — Ambulatory Visit: Payer: Self-pay | Attending: Family Medicine | Admitting: Family Medicine

## 2019-09-24 ENCOUNTER — Encounter: Payer: Self-pay | Admitting: Family Medicine

## 2019-09-24 DIAGNOSIS — E559 Vitamin D deficiency, unspecified: Secondary | ICD-10-CM | POA: Insufficient documentation

## 2019-09-24 MED ORDER — VITAMIN D (ERGOCALCIFEROL) 1.25 MG (50000 UNIT) PO CAPS: 50000.0000 [IU] | ORAL_CAPSULE | ORAL | 0 refills | Status: DC

## 2019-09-24 MED FILL — VIT D2 1.25 MG (50,000 UNIT: 1.25 MG | 28 days supply | Qty: 4 | Fill #0

## 2019-09-24 NOTE — Progress Notes (Signed)
Virtual Visit via Telephone Note  I connected with Kristine Garcia  on 09/24/19 at 10:30 AM EDT by telephone and verified that I am speaking with the correct person using two identifiers.   I discussed the limitations, risks, security and privacy concerns of performing an evaluation and management service by telephone and the availability of in person appointments. I also discussed with the patient that there may be a patient responsible charge related to this service. The patient expressed understanding and agreed to proceed.  Patient Location: Home Provider Location: [facility] Others participating in call: none   History of Present Illness:        23 year old female who is status post telemedicine visit on 07/24/2019 due to the complaint of lower extremity discomfort and feeling as if her legs will give away.  She states that this has been going on for several years and patient per note wanted blood work checked.  She also reported family history of diabetes and sickle cell trait/anemia.  Per patient her maternal grandmother and others on her mother side have sickle cell and diabetes runs that her family though she cannot give specifics on which members of her family may have diabetes or other family members with sickle cell as she states that she does not really talk with other members of her family.  Patient reports that her leg discomfort has resolved since her last visit.  She was notified that lab work indicated that she had low vitamin D but patient states that she has not picked up the prescription as she did not have the funds to afford the medication.  She states that she otherwise is having no issues currently.  She denies headaches or dizziness, no chest pain or palpitations, no shortness of breath or cough no abdominal pain, no nausea or vomiting.  No joint pain, no peripheral edema.  She has some mild fatigue.   Past Medical History:  Diagnosis Date  . Known health problems: none     Past Surgical History:  Procedure Laterality Date  . NO PAST SURGERIES      Family History  Problem Relation Age of Onset  . Hypertension Mother   . Hypertension Father   . Sickle cell trait Maternal Grandmother     Social History   Tobacco Use  . Smoking status: Never Smoker  . Smokeless tobacco: Never Used  Substance Use Topics  . Alcohol use: Yes    Comment: social  . Drug use: Yes    Types: Marijuana    Comment: occ     No Known Allergies     Observations/Objective: No vital signs or physical exam conducted as visit was done via telephone  Assessment and Plan: 1. Vitamin D deficiency Patient with vitamin D deficiency with vitamin D level of 10.4 on 07/24/2019.  She reports that she has not picked up the vitamin D prescription at pharmacy has started an over-the-counter vitamin D supplement due to the cost/lack of funds.  Vitamin D deficiency discussed with the patient and she states that she will try to pick up the prescription from this pharmacy.  Medication prescription will be resent to pharmacy.  Patient was asked to make follow-up appointment in approximately 5 to 6 months for repeat of vitamin D level.  Follow Up Instructions:Return in about 6 months (around 03/24/2020) for vitamin D deficiency.    I discussed the assessment and treatment plan with the patient. The patient was provided an opportunity to ask questions and all  were answered. The patient agreed with the plan and demonstrated an understanding of the instructions.   The patient was advised to call back or seek an in-person evaluation if the symptoms worsen or if the condition fails to improve as anticipated.  I provided 8 minutes of non-face-to-face time during this encounter.   Cain Saupe, MD

## 2019-09-24 NOTE — Progress Notes (Signed)
Patient verified DOB Patient has not taken medication today. Patient has not eaten today. Patient denies pain at this time.  

## 2019-11-30 ENCOUNTER — Other Ambulatory Visit: Payer: Self-pay

## 2019-11-30 ENCOUNTER — Emergency Department (HOSPITAL_COMMUNITY)
Admission: EM | Admit: 2019-11-30 | Discharge: 2019-11-30 | Disposition: A | Discharge status: Home / Self Care | Payer: Self-pay | Attending: Emergency Medicine | Admitting: Emergency Medicine

## 2019-11-30 ENCOUNTER — Encounter (HOSPITAL_COMMUNITY): Payer: Self-pay | Admitting: Emergency Medicine

## 2019-11-30 ENCOUNTER — Emergency Department (HOSPITAL_COMMUNITY): Payer: Self-pay

## 2019-11-30 DIAGNOSIS — F1210 Cannabis abuse, uncomplicated: Secondary | ICD-10-CM | POA: Insufficient documentation

## 2019-11-30 DIAGNOSIS — M222X2 Patellofemoral disorders, left knee: Secondary | ICD-10-CM | POA: Insufficient documentation

## 2019-11-30 DIAGNOSIS — Z79899 Other long term (current) drug therapy: Secondary | ICD-10-CM | POA: Insufficient documentation

## 2019-11-30 DIAGNOSIS — M25562 Pain in left knee: Secondary | ICD-10-CM

## 2019-11-30 HISTORY — DX: Vitamin D deficiency, unspecified: E55.9

## 2019-11-30 MED ORDER — NAPROXEN 250 MG PO TABS
500.0000 mg | ORAL_TABLET | Freq: Once | ORAL | Status: AC
  Administered 2019-11-30: 500 mg via ORAL
  Filled 2019-11-30: qty 2

## 2019-11-30 MED ORDER — NAPROXEN 500 MG PO TABS: 500.0000 mg | ORAL_TABLET | Freq: Two times a day (BID) | ORAL | 0 refills | Status: DC

## 2019-11-30 NOTE — ED Triage Notes (Signed)
Pt reports knee cap pain x 3 days, denies injury, reports difficulty. NAD, no other complaints.

## 2019-11-30 NOTE — ED Notes (Signed)
Patient verbalizes understanding of discharge instructions. Opportunity for questioning and answers were provided. Armband removed by staff, pt discharged from ED.  

## 2019-11-30 NOTE — ED Provider Notes (Signed)
[facility] EMERGENCY DEPARTMENT Provider Note   CSN: [id number] Arrival date & time: 11/30/19  2017     History No chief complaint on file.   Kristine Garcia is a 23 y.o. female.  Patient is a 23 year old female with past medical history of vitamin D deficiency presenting to the emergency department for left anterior knee pain.  Patient reports that this is been going on for the last couple of days.  Denies any injury or trauma but does report that she is working for [employer] which is a new kind of activity for her.  No history of the same in the past.  Has not tried anything for relief.        Past Medical History:  Diagnosis Date  . Known health problems: none   . Vitamin D deficiency     Patient Active Problem List   Diagnosis Date Noted  . Vitamin D deficiency 09/24/2019    Past Surgical History:  Procedure Laterality Date  . NO PAST SURGERIES       OB History   No obstetric history on file.     Family History  Problem Relation Age of Onset  . Hypertension Mother   . Hypertension Father   . Sickle cell trait Maternal Grandmother     Social History   Tobacco Use  . Smoking status: Never Smoker  . Smokeless tobacco: Never Used  Substance Use Topics  . Alcohol use: Yes    Comment: social  . Drug use: Yes    Types: Marijuana    Comment: occ    Home Medications Prior to Admission medications   Medication Sig Start Date End Date Taking? Authorizing Provider  Vitamin D, Ergocalciferol, (DRISDOL) 1.25 MG (50000 UT) CAPS capsule Take 1 capsule (50,000 Units total) by mouth every 7 (seven) days. 09/24/19   Antony Blackbird, MD    Allergies    Patient has no known allergies.  Review of Systems   Review of Systems  Constitutional: Negative for chills and fever.  Gastrointestinal: Negative for nausea and vomiting.  Musculoskeletal: Positive for arthralgias and gait problem. Negative for back pain, joint swelling, myalgias, neck pain and neck  stiffness.  Skin: Negative for color change, rash and wound.  Neurological: Negative for numbness.    Physical Exam Updated Vital Signs BP 138/81 (BP Location: Right Arm)   Pulse 71   Temp 98.3 F (36.8 C) (Oral)   Resp 14   LMP 11/23/2019   SpO2 100%   Physical Exam Vitals and nursing note reviewed.  Constitutional:      General: She is not in acute distress.    Appearance: Normal appearance. She is obese. She is not ill-appearing, toxic-appearing or diaphoretic.  HENT:     Head: Normocephalic.  Eyes:     Conjunctiva/sclera: Conjunctivae normal.  Pulmonary:     Effort: Pulmonary effort is normal.  Musculoskeletal:     Left knee: No swelling, deformity, effusion, erythema, ecchymosis or bony tenderness. Normal range of motion. Tenderness present over the patellar tendon. No medial joint line, lateral joint line, MCL, LCL, ACL or PCL tenderness. Normal alignment.     Right lower leg: No edema.     Left lower leg: No edema.  Skin:    General: Skin is dry.     Capillary Refill: Capillary refill takes less than 2 seconds.  Neurological:     Mental Status: She is alert.     Sensory: No sensory deficit.  Motor: No weakness.  Psychiatric:        Mood and Affect: Mood normal.     ED Results / Procedures / Treatments   Labs (all labs ordered are listed, but only abnormal results are displayed) Labs Reviewed - No data to display  EKG None  Radiology DG Knee Complete 4 Views Left  Result Date: 11/30/2019 CLINICAL DATA:  Right patellar pain for 3 days.  No known injury. EXAM: LEFT KNEE - COMPLETE 4+ VIEW COMPARISON:  None. FINDINGS: No evidence of fracture, dislocation, or joint effusion. No evidence of arthropathy or other focal bone abnormality. Soft tissues are unremarkable. IMPRESSION: Normal exam. Electronically Signed   By: Francene Boyers M.D.   On: 11/30/2019 20:49    Procedures Procedures (including critical care time)  Medications Ordered in ED Medications   naproxen (NAPROSYN) tablet 500 mg (has no administration in time range)    ED Course  I have reviewed the triage vital signs and the nursing notes.  Pertinent labs & imaging results that were available during my care of the patient were reviewed by me and considered in my medical decision making (see chart for details).  Clinical Course as of Nov 29 2141  Wynelle Link Nov 30, 2019  2141 Patient with atraumatic left knee pain.  Tender on the patella tendon without effusion, skin changes or erythema.  Neurovascularly intact.  X-ray is normal.  We will treat her with NSAIDs and referred to sports medicine.  This is likely caused due to recent activity change with a new job with [employer].  No signs for septic joint   [KM]    Clinical Course User Index [KM] Jeral Pinch   MDM Rules/Calculators/A&P                      Based on review of vitals, medical screening exam, lab work and/or imaging, there does not appear to be an acute, emergent etiology for the patient's symptoms. Counseled pt on good return precautions and encouraged both PCP and ED follow-up as needed.  Prior to discharge, I also discussed incidental imaging findings with patient in detail and advised appropriate, recommended follow-up in detail.  Clinical Impression: 1. Patellofemoral arthralgia of left knee     Disposition: Discharge  Prior to providing a prescription for a controlled substance, I independently reviewed the patient's recent prescription history on the [place] Controlled Substance Reporting System. The patient had no recent or regular prescriptions and was deemed appropriate for a brief, less than 3 day prescription of narcotic for acute analgesia.  This note was prepared with assistance of Conservation officer, historic buildings. Occasional wrong-word or sound-a-like substitutions may have occurred due to the inherent limitations of voice recognition software.'  Final Clinical Impression(s) / ED  Diagnoses Final diagnoses:  Patellofemoral arthralgia of left knee    Rx / DC Orders ED Discharge Orders    None       Jeral Pinch 11/30/19 2142    Little, Ambrose Finland, MD 11/30/19 2145

## 2019-11-30 NOTE — Discharge Instructions (Signed)
Based on review of vitals, medical screening exam, lab work and/or imaging, there does not appear to be an acute, emergent etiology for the patient's symptoms. Counseled pt on good return precautions and encouraged both PCP and ED follow-up as needed.  Prior to discharge, I also discussed incidental imaging findings with patient in detail and advised appropriate, recommended follow-up in detail.  Clinical Impression: 1. Patellofemoral arthralgia of left knee     Disposition: Discharge  Prior to providing a prescription for a controlled substance, I independently reviewed the patient's recent prescription history on the [place]. The patient had no recent or regular prescriptions and was deemed appropriate for a brief, less than 3 day prescription of narcotic for acute analgesia.  This note was prepared with assistance of Systems analyst. Occasional wrong-word or sound-a-like substitutions may have occurred due to the inherent limitations of voice recognition software.

## 2020-02-03 ENCOUNTER — Other Ambulatory Visit: Payer: Self-pay

## 2020-02-03 ENCOUNTER — Ambulatory Visit (HOSPITAL_COMMUNITY)
Admission: EM | Admit: 2020-02-03 | Discharge: 2020-02-03 | Disposition: A | Discharge status: Home / Self Care | Payer: Self-pay | Attending: Family Medicine | Admitting: Family Medicine

## 2020-02-03 ENCOUNTER — Encounter (HOSPITAL_COMMUNITY): Payer: Self-pay

## 2020-02-03 DIAGNOSIS — R110 Nausea: Secondary | ICD-10-CM

## 2020-02-03 DIAGNOSIS — Z3202 Encounter for pregnancy test, result negative: Secondary | ICD-10-CM

## 2020-02-03 DIAGNOSIS — R350 Frequency of micturition: Secondary | ICD-10-CM

## 2020-02-03 DIAGNOSIS — R1030 Lower abdominal pain, unspecified: Secondary | ICD-10-CM

## 2020-02-03 LAB — POCT URINALYSIS DIP (DEVICE)
Glucose, UA: NEGATIVE mg/dL
Leukocytes,Ua: NEGATIVE
Nitrite: NEGATIVE
Protein, ur: 30 mg/dL — AB
Specific Gravity, Urine: >=1.03 (ref 1.005–1.030)
Urobilinogen, UA: 0.2 mg/dL (ref 0.0–1.0)
pH: 5.5 (ref 5.0–8.0)

## 2020-02-03 LAB — POC URINE PREG, ED: Preg Test, Ur: NEGATIVE

## 2020-02-03 LAB — POCT PREGNANCY, URINE: Preg Test, Ur: NEGATIVE

## 2020-02-03 NOTE — Discharge Instructions (Addendum)
Urine did not show any infection Your do appear to be mildly dehydrated.  I believe that most of your symptoms may be related to constipation 1 scoop of miralax 2 times a day until a good bowel movement.  Then back off to as needed.  If symptoms do not improve or worsen please follow up.

## 2020-02-03 NOTE — ED Triage Notes (Signed)
Pt present nausea and possible pregnancy. Pt states that she has been dryheaving for a week ago.

## 2020-02-04 LAB — URINE CULTURE: Culture: <10000 — AB

## 2020-02-04 NOTE — ED Provider Notes (Signed)
[place]    CSN: [id number] Arrival date & time: 02/03/20  1006      History   Chief Complaint Chief Complaint  Patient presents with  . Possible Pregnancy  . Nausea    HPI Kristine Garcia is a 24 y.o. female.   Patient is a 24 year old female the presents today with nausea, dry heaving, lower abdominal discomfort, loss of appetite.  Symptoms have been constant, waxing waning over the past week or so.  Reporting she is concerned to be pregnant.  Patient's last menstrual period was 01/18/2020. She has had some associated urinary frequency.  Reporting regular bowel movements.  No blood in stool.  Denies any vaginal discharge, itching or irritation.  Had recent work-up with negative STD screening.  Not concerned for STDs today.  Denies any dysuria, hematuria or urinary frequency.  No fevers.  ROS per HPI      Past Medical History:  Diagnosis Date  . Known health problems: none   . Vitamin D deficiency     Patient Active Problem List   Diagnosis Date Noted  . Vitamin D deficiency 09/24/2019    Past Surgical History:  Procedure Laterality Date  . NO PAST SURGERIES      OB History   No obstetric history on file.      Home Medications    Prior to Admission medications   Medication Sig Start Date End Date Taking? Authorizing Provider  naproxen (NAPROSYN) 500 MG tablet Take 1 tablet (500 mg total) by mouth 2 (two) times daily. 11/30/19   Alveria Apley, PA-C  Vitamin D, Ergocalciferol, (DRISDOL) 1.25 MG (50000 UT) CAPS capsule Take 1 capsule (50,000 Units total) by mouth every 7 (seven) days. 09/24/19   Antony Blackbird, MD    Family History Family History  Problem Relation Age of Onset  . Hypertension Mother   . Hypertension Father   . Sickle cell trait Maternal Grandmother     Social History Social History   Tobacco Use  . Smoking status: Never Smoker  . Smokeless tobacco: Never Used  Substance Use Topics  . Alcohol use: Yes    Comment:  social  . Drug use: Yes    Types: Marijuana    Comment: occ     Allergies   Patient has no known allergies.   Review of Systems Review of Systems   Physical Exam Triage Vital Signs ED Triage Vitals  Enc Vitals Group     BP 02/03/20 1054 120/81     Pulse Rate 02/03/20 1054 68     Resp 02/03/20 1054 16     Temp 02/03/20 1054 98 F (36.7 C)     Temp Source 02/03/20 1054 Oral     SpO2 02/03/20 1054 100 %     Weight --      Height --      Head Circumference --      Peak Flow --      Pain Score 02/03/20 1051 7     Pain Loc --      Pain Edu? --      Excl. in [place]? --    No data found.  Updated Vital Signs BP 120/81 (BP Location: Right Arm)   Pulse 68   Temp 98 F (36.7 C) (Oral)   Resp 16   LMP 01/18/2020   SpO2 100%   Visual Acuity Right Eye Distance:   Left Eye Distance:   Bilateral Distance:    Right Eye Near:  Left Eye Near:    Bilateral Near:     Physical Exam Vitals and nursing note reviewed.  Constitutional:      General: She is not in acute distress.    Appearance: Normal appearance. She is obese. She is not ill-appearing, toxic-appearing or diaphoretic.  HENT:     Head: Normocephalic.     Nose: Nose normal.     Mouth/Throat:     Pharynx: Oropharynx is clear.  Eyes:     Conjunctiva/sclera: Conjunctivae normal.  Pulmonary:     Effort: Pulmonary effort is normal.  Abdominal:     Palpations: Abdomen is soft.     Tenderness: There is no abdominal tenderness.  Musculoskeletal:        General: Normal range of motion.     Cervical back: Normal range of motion.  Skin:    General: Skin is warm and dry.     Findings: No rash.  Neurological:     Mental Status: She is alert.  Psychiatric:        Mood and Affect: Mood normal.      UC Treatments / Results  Labs (all labs ordered are listed, but only abnormal results are displayed) Labs Reviewed  POCT URINALYSIS DIP (DEVICE) - Abnormal; Notable for the following components:      Result Value    Bilirubin Urine SMALL (*)    Ketones, ur TRACE (*)    Hgb urine dipstick MODERATE (*)    Protein, ur 30 (*)    All other components within normal limits  URINE CULTURE  GLUCOSE, CAPILLARY  POCT PREGNANCY, URINE  POC URINE PREG, ED  CBG MONITORING, ED    EKG   Radiology No results found.  Procedures Procedures (including critical care time)  Medications Ordered in UC Medications - No data to display  Initial Impression / Assessment and Plan / UC Course  I have reviewed the triage vital signs and the nursing notes.  Pertinent labs & imaging results that were available during my care of the patient were reviewed by me and considered in my medical decision making (see chart for details).     24 year old female with intermittent, waxing waning lower abdominal cramping, nausea and loss of appetite. Urine with moderate blood, trace ketones, bilirubin and protein.  Patient appears to be a little dehydrated.  No signs of infection. Pregnancy test negative Patient refusing STD screening No acute abdomen on exam.  Vital signs stable and she is nontoxic or ill-appearing. Most likely symptoms are related to constipation We will have her do MiraLAX to treat. Recommended push fluids Follow up as needed for continued or worsening symptoms  Final Clinical Impressions(s) / UC Diagnoses   Final diagnoses:  Nausea without vomiting  Lower abdominal pain     Discharge Instructions     Urine did not show any infection Your do appear to be mildly dehydrated.  I believe that most of your symptoms may be related to constipation 1 scoop of miralax 2 times a day until a good bowel movement.  Then back off to as needed.  If symptoms do not improve or worsen please follow up.      ED Prescriptions    None     PDMP not reviewed this encounter.   Dahlia Byes A, NP 02/04/20 [phone]

## 2020-03-15 ENCOUNTER — Other Ambulatory Visit: Payer: Self-pay

## 2020-03-15 ENCOUNTER — Encounter (HOSPITAL_COMMUNITY): Payer: Self-pay

## 2020-03-15 ENCOUNTER — Emergency Department (HOSPITAL_COMMUNITY)
Admission: EM | Admit: 2020-03-15 | Discharge: 2020-03-15 | Disposition: A | Discharge status: Home / Self Care | Payer: Self-pay | Attending: Emergency Medicine | Admitting: Emergency Medicine

## 2020-03-15 DIAGNOSIS — Z5321 Procedure and treatment not carried out due to patient leaving prior to being seen by health care provider: Secondary | ICD-10-CM | POA: Insufficient documentation

## 2020-03-15 DIAGNOSIS — Z3200 Encounter for pregnancy test, result unknown: Secondary | ICD-10-CM | POA: Insufficient documentation

## 2020-03-15 NOTE — ED Triage Notes (Signed)
Patient arrived stating she took a pregnancy test today and it was positive. Patient came to ED for blood test and an ultrasound to confirm. No complaints of pain or bleeding. Reports some nausea.

## 2020-03-31 ENCOUNTER — Ambulatory Visit: Payer: Self-pay | Admitting: Family Medicine

## 2020-05-10 ENCOUNTER — Ambulatory Visit (HOSPITAL_COMMUNITY)
Admission: EM | Admit: 2020-05-10 | Discharge: 2020-05-10 | Disposition: A | Discharge status: Home / Self Care | Payer: Self-pay | Attending: Family Medicine | Admitting: Family Medicine

## 2020-05-10 ENCOUNTER — Other Ambulatory Visit: Payer: Self-pay

## 2020-05-10 DIAGNOSIS — R300 Dysuria: Secondary | ICD-10-CM | POA: Insufficient documentation

## 2020-05-10 DIAGNOSIS — Z3202 Encounter for pregnancy test, result negative: Secondary | ICD-10-CM

## 2020-05-10 DIAGNOSIS — N760 Acute vaginitis: Secondary | ICD-10-CM | POA: Insufficient documentation

## 2020-05-10 LAB — POCT URINALYSIS DIP (DEVICE)
Bilirubin Urine: NEGATIVE
Glucose, UA: NEGATIVE mg/dL
Hgb urine dipstick: NEGATIVE
Ketones, ur: NEGATIVE mg/dL
Nitrite: NEGATIVE
Protein, ur: NEGATIVE mg/dL
Specific Gravity, Urine: 1.025 (ref 1.005–1.030)
Urobilinogen, UA: 0.2 mg/dL (ref 0.0–1.0)
pH: 6 (ref 5.0–8.0)

## 2020-05-10 LAB — POC URINE PREG, ED: Preg Test, Ur: NEGATIVE

## 2020-05-10 MED ORDER — METRONIDAZOLE 500 MG PO TABS: 500.0000 mg | ORAL_TABLET | Freq: Two times a day (BID) | ORAL | 0 refills | Status: AC

## 2020-05-10 MED ORDER — FLUCONAZOLE 150 MG PO TABS: 150.0000 mg | ORAL_TABLET | Freq: Once | ORAL | 0 refills | Status: AC

## 2020-05-10 NOTE — ED Provider Notes (Signed)
[facility]    CSN: [id number] Arrival date & time: 05/10/20  0855      History   Chief Complaint Chief Complaint  Patient presents with   Dysuria    HPI Kristine Garcia is a 24 y.o. female presenting today for evaluation of dysuria and urinary frequency.  Patient reports that over the past 2 to 3 days she has had burning with urination as well as feeling very irritated.  She feels sensation of irritation all the time, but is amplified with urination.  She denies any specific discharge that she has noted.  Denies abdominal pain, back pain.  Denies fevers vomiting.  Has felt slightly nauseous.  HPI  Past Medical History:  Diagnosis Date   Known health problems: none    Vitamin D deficiency     Patient Active Problem List   Diagnosis Date Noted   Vitamin D deficiency 09/24/2019    Past Surgical History:  Procedure Laterality Date   NO PAST SURGERIES      OB History   No obstetric history on file.      Home Medications    Prior to Admission medications   Medication Sig Start Date End Date Taking? Authorizing Provider  fluconazole (DIFLUCAN) 150 MG tablet Take 1 tablet (150 mg total) by mouth once for 1 dose. 05/10/20 05/10/20  Dravin Lance C, PA-C  metroNIDAZOLE (FLAGYL) 500 MG tablet Take 1 tablet (500 mg total) by mouth 2 (two) times daily for 7 days. 05/10/20 05/17/20  Kylin Genna, Junius Creamer, PA-C    Family History Family History  Problem Relation Age of Onset   Hypertension Mother    Hypertension Father    Sickle cell trait Maternal Grandmother     Social History Social History   Tobacco Use   Smoking status: Never Smoker   Smokeless tobacco: Never Used  Substance Use Topics   Alcohol use: Yes    Comment: social   Drug use: Yes    Types: Marijuana    Comment: occ     Allergies   Patient has no known allergies.   Review of Systems Review of Systems  Constitutional: Negative for fever.  Respiratory: Negative for shortness of  breath.   Cardiovascular: Negative for chest pain.  Gastrointestinal: Positive for nausea. Negative for abdominal pain, diarrhea and vomiting.  Genitourinary: Positive for dysuria, frequency and vaginal discharge. Negative for flank pain, genital sores, hematuria, menstrual problem, vaginal bleeding and vaginal pain.  Musculoskeletal: Negative for back pain.  Skin: Negative for rash.  Neurological: Negative for dizziness, light-headedness and headaches.     Physical Exam Triage Vital Signs ED Triage Vitals  Enc Vitals Group     BP --      Pulse Rate 05/10/20 1001 66     Resp 05/10/20 1001 16     Temp 05/10/20 1001 98.4 F (36.9 C)     Temp src --      SpO2 05/10/20 1001 99 %     Weight --      Height --      Head Circumference --      Peak Flow --      Pain Score 05/10/20 1004 0     Pain Loc --      Pain Edu? --      Excl. in [place]? --    No data found.  Updated Vital Signs Pulse 66    Temp 98.4 F (36.9 C)    Resp 16    LMP  04/08/2020    SpO2 99%   Visual Acuity Right Eye Distance:   Left Eye Distance:   Bilateral Distance:    Right Eye Near:   Left Eye Near:    Bilateral Near:     Physical Exam Vitals and nursing note reviewed.  Constitutional:      Appearance: She is well-developed.     Comments: No acute distress  HENT:     Head: Normocephalic and atraumatic.     Nose: Nose normal.  Eyes:     Conjunctiva/sclera: Conjunctivae normal.  Cardiovascular:     Rate and Rhythm: Normal rate.  Pulmonary:     Effort: Pulmonary effort is normal. No respiratory distress.  Abdominal:     General: There is no distension.     Comments: Soft, nondistended, nontender to light and deep palpation throughout abdomen  Musculoskeletal:        General: Normal range of motion.     Cervical back: Neck supple.  Skin:    General: Skin is warm and dry.  Neurological:     Mental Status: She is alert and oriented to person, place, and time.      UC Treatments / Results    Labs (all labs ordered are listed, but only abnormal results are displayed) Labs Reviewed  POCT URINALYSIS DIP (DEVICE) - Abnormal; Notable for the following components:      Result Value   Leukocytes,Ua TRACE (*)    All other components within normal limits  POC URINE PREG, ED  CERVICOVAGINAL ANCILLARY ONLY    EKG   Radiology No results found.  Procedures Procedures (including critical care time)  Medications Ordered in UC Medications - No data to display  Initial Impression / Assessment and Plan / UC Course  I have reviewed the triage vital signs and the nursing notes.  Pertinent labs & imaging results that were available during my care of the patient were reviewed by me and considered in my medical decision making (see chart for details).    Trace leuks on UA, otherwise UA unremarkable, less suggestive of UTI.  Pregnancy test negative.  Suspect most likely vaginitis with cause of yeast or BV.  Has history of both, empirically treating for both.  Vaginal swab pending for STD screening as well as to confirm cause of symptoms.  Will alter treatment as needed based off results.  Discussed strict return precautions. Patient verbalized understanding and is agreeable with plan.   Final Clinical Impressions(s) / UC Diagnoses   Final diagnoses:  Dysuria  Vaginitis and vulvovaginitis     Discharge Instructions     Urine did not show signs of urinary tract infection.  I am treating you for vaginitis-likely yeast and/or BV.  Begin metronidazole twice daily x1 week, take with food.  No alcohol while taking.  Diflucan prescribed for after antibiotic use if still having itching/irritation.  We are testing you for Gonorrhea, Chlamydia, Trichomonas, Yeast and Bacterial Vaginosis. We will call you if anything is positive and let you know if you require any further treatment. Please inform partners of any positive results.   Please return if symptoms not improving with treatment,  development of fever, nausea, vomiting, abdominal pain.     ED Prescriptions    Medication Sig Dispense Auth. Provider   metroNIDAZOLE (FLAGYL) 500 MG tablet Take 1 tablet (500 mg total) by mouth 2 (two) times daily for 7 days. 14 tablet Kelsen Celona C, PA-C   fluconazole (DIFLUCAN) 150 MG tablet Take 1 tablet (  150 mg total) by mouth once for 1 dose. 2 tablet Minyon Billiter, [place] C, PA-C     PDMP not reviewed this encounter.   Joneen Caraway [place] C, PA-C 05/10/20 1039

## 2020-05-10 NOTE — ED Triage Notes (Signed)
Pt c/o painful urination and frequency x 2 days. Pt requesting STD testing. Denies vaginal discharge

## 2020-05-10 NOTE — Discharge Instructions (Signed)
Urine did not show signs of urinary tract infection.  I am treating you for vaginitis-likely yeast and/or BV.  Begin metronidazole twice daily x1 week, take with food.  No alcohol while taking.  Diflucan prescribed for after antibiotic use if still having itching/irritation.  We are testing you for Gonorrhea, Chlamydia, Trichomonas, Yeast and Bacterial Vaginosis. We will call you if anything is positive and let you know if you require any further treatment. Please inform partners of any positive results.   Please return if symptoms not improving with treatment, development of fever, nausea, vomiting, abdominal pain.

## 2020-05-11 LAB — CERVICOVAGINAL ANCILLARY ONLY
Bacterial Vaginitis (gardnerella): NEGATIVE
Candida Glabrata: POSITIVE — AB
Candida Vaginitis: POSITIVE — AB
Chlamydia: NEGATIVE
Comment: NEGATIVE
Comment: NEGATIVE
Comment: NEGATIVE
Comment: NEGATIVE
Comment: NEGATIVE
Comment: NORMAL
Neisseria Gonorrhea: NEGATIVE
Trichomonas: NEGATIVE

## 2020-11-09 ENCOUNTER — Other Ambulatory Visit: Payer: Self-pay

## 2020-11-09 ENCOUNTER — Emergency Department (HOSPITAL_COMMUNITY): Payer: Self-pay

## 2020-11-09 ENCOUNTER — Emergency Department (HOSPITAL_COMMUNITY)
Admission: EM | Admit: 2020-11-09 | Discharge: 2020-11-09 | Disposition: A | Discharge status: Home / Self Care | Payer: Self-pay | Attending: Emergency Medicine | Admitting: Emergency Medicine

## 2020-11-09 ENCOUNTER — Ambulatory Visit (HOSPITAL_COMMUNITY): Payer: Self-pay

## 2020-11-09 ENCOUNTER — Encounter (HOSPITAL_COMMUNITY): Payer: Self-pay | Admitting: Emergency Medicine

## 2020-11-09 DIAGNOSIS — W182XXA Fall in (into) shower or empty bathtub, initial encounter: Secondary | ICD-10-CM | POA: Insufficient documentation

## 2020-11-09 DIAGNOSIS — W19XXXA Unspecified fall, initial encounter: Secondary | ICD-10-CM

## 2020-11-09 DIAGNOSIS — S99911A Unspecified injury of right ankle, initial encounter: Secondary | ICD-10-CM | POA: Insufficient documentation

## 2020-11-09 DIAGNOSIS — Z5321 Procedure and treatment not carried out due to patient leaving prior to being seen by health care provider: Secondary | ICD-10-CM | POA: Insufficient documentation

## 2020-11-09 NOTE — ED Triage Notes (Signed)
Per pt, states she fell in the shower injuring her right ankle-no obvious swelling or deformity

## 2021-04-03 ENCOUNTER — Emergency Department (HOSPITAL_COMMUNITY): Payer: Self-pay

## 2021-04-03 ENCOUNTER — Encounter (HOSPITAL_COMMUNITY): Payer: Self-pay

## 2021-04-03 ENCOUNTER — Emergency Department (HOSPITAL_COMMUNITY)
Admission: EM | Admit: 2021-04-03 | Discharge: 2021-04-03 | Disposition: A | Discharge status: Home / Self Care | Payer: Self-pay | Attending: Emergency Medicine | Admitting: Emergency Medicine

## 2021-04-03 ENCOUNTER — Other Ambulatory Visit: Payer: Self-pay

## 2021-04-03 DIAGNOSIS — E876 Hypokalemia: Secondary | ICD-10-CM

## 2021-04-03 DIAGNOSIS — R0789 Other chest pain: Secondary | ICD-10-CM

## 2021-04-03 DIAGNOSIS — R112 Nausea with vomiting, unspecified: Secondary | ICD-10-CM | POA: Insufficient documentation

## 2021-04-03 DIAGNOSIS — R7989 Other specified abnormal findings of blood chemistry: Secondary | ICD-10-CM

## 2021-04-03 DIAGNOSIS — R079 Chest pain, unspecified: Secondary | ICD-10-CM | POA: Insufficient documentation

## 2021-04-03 LAB — COMPREHENSIVE METABOLIC PANEL
ALT: 65 U/L — ABNORMAL HIGH (ref 0–44)
AST: 122 U/L — ABNORMAL HIGH (ref 15–41)
Albumin: 3.7 g/dL (ref 3.5–5.0)
Alkaline Phosphatase: 71 U/L (ref 38–126)
Anion gap: 7 (ref 5–15)
BUN: 14 mg/dL (ref 6–20)
CO2: 26 mmol/L (ref 22–32)
Calcium: 9.2 mg/dL (ref 8.9–10.3)
Chloride: 105 mmol/L (ref 98–111)
Creatinine, Ser: 1 mg/dL (ref 0.44–1.00)
GFR, Estimated: >60 mL/min (ref >60)
Glucose, Bld: 110 mg/dL — ABNORMAL HIGH (ref 70–99)
Potassium: 3.4 mmol/L — ABNORMAL LOW (ref 3.5–5.1)
Sodium: 138 mmol/L (ref 135–145)
Total Bilirubin: 0.6 mg/dL (ref 0.3–1.2)
Total Protein: 7.3 g/dL (ref 6.5–8.1)

## 2021-04-03 LAB — CBC
HCT: 38.4 % (ref 36.0–46.0)
Hemoglobin: 12.2 g/dL (ref 12.0–15.0)
MCH: 30.1 pg (ref 26.0–34.0)
MCHC: 31.8 g/dL (ref 30.0–36.0)
MCV: 94.8 fL (ref 80.0–100.0)
Platelets: 179 10*3/uL (ref 150–400)
RBC: 4.05 MIL/uL (ref 3.87–5.11)
RDW: 13.2 % (ref 11.5–15.5)
WBC: 10.7 10*3/uL — ABNORMAL HIGH (ref 4.0–10.5)
nRBC: 0 % (ref 0.0–0.2)

## 2021-04-03 LAB — LIPASE, BLOOD: Lipase: 24 U/L (ref 11–51)

## 2021-04-03 LAB — TROPONIN I (HIGH SENSITIVITY)
Troponin I (High Sensitivity): 3 ng/L (ref <18)
Troponin I (High Sensitivity): 3 ng/L (ref <18)

## 2021-04-03 LAB — I-STAT BETA HCG BLOOD, ED (MC, WL, AP ONLY): I-stat hCG, quantitative: <5 m[IU]/mL (ref <5)

## 2021-04-03 MED ORDER — ALUM & MAG HYDROXIDE-SIMETH 200-200-20 MG/5ML PO SUSP
30.0000 mL | Freq: Once | ORAL | Status: AC
  Administered 2021-04-03: 30 mL via ORAL
  Filled 2021-04-03: qty 30

## 2021-04-03 MED ORDER — POTASSIUM CHLORIDE CRYS ER 20 MEQ PO TBCR
40.0000 meq | EXTENDED_RELEASE_TABLET | Freq: Once | ORAL | Status: AC
  Administered 2021-04-03: 40 meq via ORAL
  Filled 2021-04-03: qty 2

## 2021-04-03 MED ORDER — ACETAMINOPHEN 500 MG PO TABS
1000.0000 mg | ORAL_TABLET | Freq: Once | ORAL | Status: AC
  Administered 2021-04-03: 1000 mg via ORAL
  Filled 2021-04-03: qty 2

## 2021-04-03 MED ORDER — FAMOTIDINE 20 MG PO TABS
20.0000 mg | ORAL_TABLET | Freq: Once | ORAL | Status: AC
  Administered 2021-04-03: 20 mg via ORAL
  Filled 2021-04-03: qty 1

## 2021-04-03 NOTE — ED Provider Notes (Addendum)
[facility] EMERGENCY DEPARTMENT Provider Note   CSN: [id number] Arrival date & time: 04/03/21  1037     History No chief complaint on file.   Kristine Garcia is a 25 y.o. female.  Patient c/o midline lower chest pain onset  At work today after eating chicken salad this AM. Symptoms acute onset, dull, moderate, non radiating, associated wih nausea/vomiting. No bloody or bilious emesis. No abd distension or pain. No diarrhea or constipation. No fever or chills. No dysuria or gu c/o. Denies cough or upper resp symptoms. No hx cad or fam hx premature cad. No back or flank pain. No pleuritic pain. No leg pain or swelling.   The history is provided by the patient and the EMS personnel.       Past Medical History:  Diagnosis Date  . Known health problems: none   . Vitamin D deficiency     Patient Active Problem List   Diagnosis Date Noted  . Vitamin D deficiency 09/24/2019    Past Surgical History:  Procedure Laterality Date  . NO PAST SURGERIES       OB History   No obstetric history on file.     Family History  Problem Relation Age of Onset  . Hypertension Mother   . Hypertension Father   . Sickle cell trait Maternal Grandmother     Social History   Tobacco Use  . Smoking status: Never Smoker  . Smokeless tobacco: Never Used  Vaping Use  . Vaping Use: Never used  Substance Use Topics  . Alcohol use: Yes    Comment: social  . Drug use: Yes    Types: Marijuana    Comment: occ    Home Medications Prior to Admission medications   Not on File    Allergies    Patient has no known allergies.  Review of Systems   Review of Systems  Constitutional: Negative for chills and fever.  HENT: Negative for sore throat.   Eyes: Negative for redness.  Respiratory: Negative for cough and shortness of breath.   Cardiovascular: Positive for chest pain.  Gastrointestinal: Positive for nausea and vomiting. Negative for abdominal pain.  Genitourinary:  Negative for dysuria and flank pain.  Musculoskeletal: Negative for back pain and neck pain.  Skin: Negative for rash.  Neurological: Negative for headaches.  Hematological: Does not bruise/bleed easily.  Psychiatric/Behavioral: Negative for confusion.    Physical Exam Updated Vital Signs BP 131/83   Pulse 71   Resp 15   SpO2 100%   Physical Exam Vitals and nursing note reviewed.  Constitutional:      Appearance: Normal appearance. She is well-developed.  HENT:     Head: Atraumatic.     Nose: Nose normal.     Mouth/Throat:     Mouth: Mucous membranes are moist.  Eyes:     General: No scleral icterus.    Conjunctiva/sclera: Conjunctivae normal.  Neck:     Trachea: No tracheal deviation.  Cardiovascular:     Rate and Rhythm: Normal rate and regular rhythm.     Pulses: Normal pulses.     Heart sounds: Normal heart sounds. No murmur heard. No friction rub. No gallop.   Pulmonary:     Effort: Pulmonary effort is normal. No respiratory distress.     Breath sounds: Normal breath sounds.     Comments: Mild chest wall tenderness. No sts. Normal chest movement. No crepitus.  Chest:     Chest wall: Tenderness present.  Abdominal:  General: Bowel sounds are normal. There is no distension.     Palpations: Abdomen is soft.     Tenderness: There is no abdominal tenderness. There is no guarding.  Genitourinary:    Comments: No cva tenderness.  Musculoskeletal:        General: No swelling or tenderness.     Cervical back: Normal range of motion and neck supple. No rigidity. No muscular tenderness.     Right lower leg: No edema.     Left lower leg: No edema.  Skin:    General: Skin is warm and dry.     Findings: No rash.  Neurological:     Mental Status: She is alert.     Comments: Alert, speech normal.   Psychiatric:        Mood and Affect: Mood normal.     ED Results / Procedures / Treatments   Labs (all labs ordered are listed, but only abnormal results are  displayed) Results for orders placed or performed during the hospital encounter of 04/03/21  Lipase, blood  Result Value Ref Range   Lipase 24 11 - 51 U/L  Comprehensive metabolic panel  Result Value Ref Range   Sodium 138 135 - 145 mmol/L   Potassium 3.4 (L) 3.5 - 5.1 mmol/L   Chloride 105 98 - 111 mmol/L   CO2 26 22 - 32 mmol/L   Glucose, Bld 110 (H) 70 - 99 mg/dL   BUN 14 6 - 20 mg/dL   Creatinine, Ser 4.23 0.44 - 1.00 mg/dL   Calcium 9.2 8.9 - 53.6 mg/dL   Total Protein 7.3 6.5 - 8.1 g/dL   Albumin 3.7 3.5 - 5.0 g/dL   AST 144 (H) 15 - 41 U/L   ALT 65 (H) 0 - 44 U/L   Alkaline Phosphatase 71 38 - 126 U/L   Total Bilirubin 0.6 0.3 - 1.2 mg/dL   GFR, Estimated >31 >54 mL/min   Anion gap 7 5 - 15  CBC  Result Value Ref Range   WBC 10.7 (H) 4.0 - 10.5 K/uL   RBC 4.05 3.87 - 5.11 MIL/uL   Hemoglobin 12.2 12.0 - 15.0 g/dL   HCT 00.8 67.6 - 19.5 %   MCV 94.8 80.0 - 100.0 fL   MCH 30.1 26.0 - 34.0 pg   MCHC 31.8 30.0 - 36.0 g/dL   RDW 09.3 26.7 - 12.4 %   Platelets 179 150 - 400 K/uL   nRBC 0.0 0.0 - 0.2 %  I-Stat beta hCG blood, ED  Result Value Ref Range   I-stat hCG, quantitative <5.0 <5 mIU/mL   Comment 3          Troponin I (High Sensitivity)  Result Value Ref Range   Troponin I (High Sensitivity) 3 <18 ng/L    EKG EKG Interpretation  Date/Time:  Sunday Apr 03 2021 10:55:29 EDT Ventricular Rate:  69 PR Interval:  174 QRS Duration: 84 QT Interval:  388 QTC Calculation: 415 R Axis:   77 Text Interpretation: Normal sinus rhythm Nonspecific T wave abnormality No previous tracing Confirmed by Cathren Laine ([id number]) on 04/03/2021 11:09:06 AM   Radiology DG Chest Port 1 View  Result Date: 04/03/2021 CLINICAL DATA:  Acute chest pain. EXAM: PORTABLE CHEST 1 VIEW COMPARISON:  None. FINDINGS: The cardiomediastinal silhouette is unremarkable. There is no evidence of focal airspace disease, pulmonary edema, suspicious pulmonary nodule/mass, pleural effusion, or  pneumothorax. No acute bony abnormalities are identified. IMPRESSION: No active disease. Electronically Signed  By: Harmon Pier M.D.   On: 04/03/2021 13:10    Procedures Procedures   Medications Ordered in ED Medications - No data to display  ED Course  I have reviewed the triage vital signs and the nursing notes.  Pertinent labs & imaging results that were available during my care of the patient were reviewed by me and considered in my medical decision making (see chart for details).    MDM Rules/Calculators/A&P                         Iv ns. Continuous pulse ox and cardiac monitoring. Stat labs. Ecg. Cxr.   Reviewed nursing notes and prior charts for additional history.   Acetaminophen po, pepcid po, maalox po, for symptom relief.   Labs reviewed/interpreted by me - chem normal except k sl low, kcl po.  trop normal.  Delta trop also normal.  No dysuria, no fever. On recheck, abd soft nt. No cva tenderness. No chest pain. Chest cta.   CXR reviewed/interpreted by me - no pna.   Po fluids, food.   Pt tolerating po.   No cp or sob.   Pt currently appears stable for d/c.   rec pcp f/u.  Return precautions provided.      Final Clinical Impression(s) / ED Diagnoses Final diagnoses:  None    Rx / DC Orders ED Discharge Orders    None         Cathren Laine, MD 04/03/21 1414

## 2021-04-03 NOTE — ED Triage Notes (Signed)
Patient arrived from home by [facility] after developing sudden onset of epigastric/chest pain after eating chicken salad this am. Patient states that the pain comes in waves and developed nausea/vomiting prior to arrival. Reports episode of diarrhea after arrival. Patient received asa 324mg  and zofran 4mg  IV pta

## 2021-04-03 NOTE — Discharge Instructions (Addendum)
It was our pleasure to provide your ER care today - we hope that you feel better.  Drink plenty of fluids/stay well hydrated.   If gi/reflux symptoms, try taking pepcid and maalox for symptom relief.  From today's lab tests, your potassium level is slightly low (3.4) - eat plenty of fruits and vegetables and follow up with primary care doctor. A couple of your liver tests are also elevated (AST 122, ALT 65) - follow up with primary care doctor in the next 1-2 weeks.   Follow up with primary care doctor in the next 1-2 days if symptoms fail to resolve.  Return to ER if worse, new symptoms, fevers, new, worsening or severe pain, persistent vomiting, severe abdominal pain, recurrent or persistent chest pain, trouble breathing, or other concern.

## 2021-09-10 ENCOUNTER — Encounter (HOSPITAL_COMMUNITY): Payer: Self-pay

## 2021-09-10 ENCOUNTER — Other Ambulatory Visit: Payer: Self-pay

## 2021-09-10 ENCOUNTER — Ambulatory Visit (HOSPITAL_COMMUNITY)
Admission: RE | Admit: 2021-09-10 | Discharge: 2021-09-10 | Disposition: A | Discharge status: Home / Self Care | Payer: Managed Care, Other (non HMO) | Source: Ambulatory Visit | Attending: Student | Admitting: Student

## 2021-09-10 VITALS — BP 127/77 | HR 70 | Temp 98.9°F | Resp 18

## 2021-09-10 DIAGNOSIS — H01001 Unspecified blepharitis right upper eyelid: Secondary | ICD-10-CM

## 2021-09-10 DIAGNOSIS — H01004 Unspecified blepharitis left upper eyelid: Secondary | ICD-10-CM

## 2021-09-10 DIAGNOSIS — E559 Vitamin D deficiency, unspecified: Secondary | ICD-10-CM | POA: Diagnosis not present

## 2021-09-10 DIAGNOSIS — L84 Corns and callosities: Secondary | ICD-10-CM

## 2021-09-10 MED ORDER — ERYTHROMYCIN 5 MG/GM OP OINT: TOPICAL_OINTMENT | OPHTHALMIC | 0 refills | Status: DC

## 2021-09-10 NOTE — ED Triage Notes (Signed)
Pt reports redness sin right eye x 1 week.   Pt reports swelling in both big toes x 1 month.   Pt reports low vitamin D.

## 2021-09-10 NOTE — ED Notes (Signed)
Pt not answered, called in lobby.  

## 2021-09-10 NOTE — ED Provider Notes (Signed)
[facility]    CSN: [id number] Arrival date & time: 09/10/21  1213      History   Chief Complaint Chief Complaint  Patient presents with   Appointment    1200    Eye Problem    HPI Kristine Garcia is a 25 y.o. female presenting with multiple complaints. Discussed that we can manage two acute issues today, she prefers eye issue and toe issue.  -Bilateral great toe pain and calluses xmonths (uncertain timeframe). Pain with prolonged standing/walking. Denies sensation changes. Denies pain elsewhere.  -Bilateral eye irritation and redness x1 week R>L. Yellow crusting in the morning. Denies vision changes.  Denies photophobia, foreign body sensation,  eye pain, eye pain with movement, injury to eye, vision changes, double vision. States she needs glasses but doesn't wear glasses or contacts.    HPI  Past Medical History:  Diagnosis Date   Known health problems: none    Vitamin D deficiency     Patient Active Problem List   Diagnosis Date Noted   Vitamin D deficiency 09/24/2019    Past Surgical History:  Procedure Laterality Date   NO PAST SURGERIES      OB History   No obstetric history on file.      Home Medications    Prior to Admission medications   Medication Sig Start Date End Date Taking? Authorizing Provider  erythromycin ophthalmic ointment Place a 1/2 inch ribbon of ointment into the lower eyelid at bedtime x7 days 09/10/21  Yes Rhys Martini, PA-C    Family History Family History  Problem Relation Age of Onset   Hypertension Mother    Hypertension Father    Sickle cell trait Maternal Grandmother     Social History Social History   Tobacco Use   Smoking status: Never   Smokeless tobacco: Never  Vaping Use   Vaping Use: Never used  Substance Use Topics   Alcohol use: Yes    Comment: social   Drug use: Yes    Types: Marijuana    Comment: occ     Allergies   Patient has no known allergies.   Review of Systems Review of  Systems  Eyes:  Positive for discharge and redness.  Musculoskeletal:        Great toe pain  All other systems reviewed and are negative.   Physical Exam Triage Vital Signs ED Triage Vitals  Enc Vitals Group     BP 09/10/21 1233 127/77     Pulse Rate 09/10/21 1233 70     Resp 09/10/21 1233 18     Temp 09/10/21 1233 98.9 F (37.2 C)     Temp Source 09/10/21 1233 Oral     SpO2 09/10/21 1233 100 %     Weight --      Height --      Head Circumference --      Peak Flow --      Pain Score 09/10/21 1234 0     Pain Loc --      Pain Edu? --      Excl. in GC? --    No data found.  Updated Vital Signs BP 127/77 (BP Location: Left Wrist)   Pulse 70   Temp 98.9 F (37.2 C) (Oral)   Resp 18   LMP  (Within Weeks) Comment: 1 week  SpO2 100%   Visual Acuity Right Eye Distance: 20/50 (Without correction) Left Eye Distance: 20/50 (Without correction) Bilateral Distance: 20/40 (Without correction)  Right  Eye Near:   Left Eye Near:    Bilateral Near:     Physical Exam Vitals reviewed.  Constitutional:      General: She is not in acute distress.    Appearance: Normal appearance. She is not ill-appearing or diaphoretic.  HENT:     Head: Normocephalic and atraumatic.     Right Ear: Tympanic membrane, ear canal and external ear normal. There is no impacted cerumen.     Left Ear: Tympanic membrane, ear canal and external ear normal. There is no impacted cerumen.     Nose: Nose normal. No congestion.     Mouth/Throat:     Pharynx: Oropharynx is clear. No posterior oropharyngeal erythema.  Eyes:     General: Lids are everted, no foreign bodies appreciated. Vision grossly intact. Gaze aligned appropriately. No visual field deficit.       Right eye: No foreign body, discharge or hordeolum.        Left eye: No foreign body, discharge or hordeolum.     Extraocular Movements: Extraocular movements intact.     Right eye: Normal extraocular motion and no nystagmus.     Left eye: Normal  extraocular motion and no nystagmus.     Conjunctiva/sclera:     Right eye: Right conjunctiva is injected. No chemosis, exudate or hemorrhage.    Left eye: Left conjunctiva is not injected. No chemosis, exudate or hemorrhage.    Pupils: Pupils are equal, round, and reactive to light.     Visual Fields: Right eye visual fields normal and left eye visual fields normal.     Comments: R conjunctiva very mildly injected medially, with scant effusion upper lid. PERRLA, EOMI without pain. No orbital tenderness. Visual acuity intact.   Cardiovascular:     Rate and Rhythm: Normal rate and regular rhythm.     Heart sounds: Normal heart sounds.  Pulmonary:     Effort: Pulmonary effort is normal.     Breath sounds: Normal breath sounds.  Skin:    General: Skin is warm.     Comments: Bilateral great toes with calluses medially. No other skin changes effusion or bony deformity. DP 2+, cap refill <2 seconds.  Neurological:     General: No focal deficit present.     Mental Status: She is alert and oriented to person, place, and time.  Psychiatric:        Mood and Affect: Mood normal.        Behavior: Behavior normal.        Thought Content: Thought content normal.        Judgment: Judgment normal.     UC Treatments / Results  Labs (all labs ordered are listed, but only abnormal results are displayed) Labs Reviewed - No data to display  EKG   Radiology No results found.  Procedures Procedures (including critical care time)  Medications Ordered in UC Medications - No data to display  Initial Impression / Assessment and Plan / UC Course  I have reviewed the triage vital signs and the nursing notes.  Pertinent labs & imaging results that were available during my care of the patient were reviewed by me and considered in my medical decision making (see chart for details).     This patient is a very pleasant 25 y.o. year old female presenting with blepharitis; calluses bilateral great  toes.  Erythromycin ointment.   F/u with podiatry, information provided.    This patient is followed by [facility] for  her primary care needs, but is unhappy with their care. PCP referral placed. Advised her to f/u with PCP for vitamin D deficiency/ concerns for sickle cell anemia/ other chronic health conditions. She is in agreement.   ED return precautions discussed. Patient verbalizes understanding and agreement.   Final Clinical Impressions(s) / UC Diagnoses   Final diagnoses:  Blepharitis of upper eyelids of both eyes, unspecified type  Callus of foot  Vitamin D deficiency     Discharge Instructions      -You have an eye infection called blepharitis.  -We are treating it with an antibiotic ointment called erythromycin.  Use this once nightly for about 7 days.  Pull down the lower eyelid, and place about half an inch inside.  This will be messy, so press the remaining ointment around the eye.  You can wash your face with gentle soap and water in the morning to wash off any remaining ointment. -Warm compresses -Seek additional medical attention if symptoms get worse, like eye pain, eye lid swelling, vision changes. Follow-up with your eye doctor if possible, but we're also happy to see you! -For your toe calluses, try a pumice stone.  If no improvement, follow-up with podiatry.  Information below, call them for this. -I placed a primary care referral, they should call you to set this up in about a week. Continue vitamin D supplement for now.    ED Prescriptions     Medication Sig Dispense Auth. Provider   erythromycin ophthalmic ointment Place a 1/2 inch ribbon of ointment into the lower eyelid at bedtime x7 days 3.5 g Rhys Martini, PA-C      PDMP not reviewed this encounter.   Rhys Martini, PA-C 09/10/21 1306

## 2021-09-10 NOTE — Discharge Instructions (Addendum)
-  You have an eye infection called blepharitis.  -We are treating it with an antibiotic ointment called erythromycin.  Use this once nightly for about 7 days.  Pull down the lower eyelid, and place about half an inch inside.  This will be messy, so press the remaining ointment around the eye.  You can wash your face with gentle soap and water in the morning to wash off any remaining ointment. -Warm compresses -Seek additional medical attention if symptoms get worse, like eye pain, eye lid swelling, vision changes. Follow-up with your eye doctor if possible, but we're also happy to see you! -For your toe calluses, try a pumice stone.  If no improvement, follow-up with podiatry.  Information below, call them for this. -I placed a primary care referral, they should call you to set this up in about a week. Continue vitamin D supplement for now.

## 2021-09-29 ENCOUNTER — Ambulatory Visit: Payer: Managed Care, Other (non HMO) | Admitting: Family

## 2021-11-17 ENCOUNTER — Other Ambulatory Visit (HOSPITAL_COMMUNITY)
Admission: EM | Admit: 2021-11-17 | Discharge: 2021-11-17 | Disposition: A | Discharge status: Home / Self Care | Payer: No Payment, Other | Attending: Family | Admitting: Family

## 2021-11-17 DIAGNOSIS — U071 COVID-19: Secondary | ICD-10-CM | POA: Insufficient documentation

## 2021-11-17 DIAGNOSIS — Z9151 Personal history of suicidal behavior: Secondary | ICD-10-CM | POA: Insufficient documentation

## 2021-11-17 DIAGNOSIS — F1290 Cannabis use, unspecified, uncomplicated: Secondary | ICD-10-CM | POA: Insufficient documentation

## 2021-11-17 DIAGNOSIS — F332 Major depressive disorder, recurrent severe without psychotic features: Secondary | ICD-10-CM | POA: Diagnosis not present

## 2021-11-17 DIAGNOSIS — Z599 Problem related to housing and economic circumstances, unspecified: Secondary | ICD-10-CM | POA: Diagnosis not present

## 2021-11-17 DIAGNOSIS — F439 Reaction to severe stress, unspecified: Secondary | ICD-10-CM | POA: Insufficient documentation

## 2021-11-17 DIAGNOSIS — Z634 Disappearance and death of family member: Secondary | ICD-10-CM | POA: Insufficient documentation

## 2021-11-17 DIAGNOSIS — Z9152 Personal history of nonsuicidal self-harm: Secondary | ICD-10-CM | POA: Insufficient documentation

## 2021-11-17 DIAGNOSIS — Z6379 Other stressful life events affecting family and household: Secondary | ICD-10-CM | POA: Insufficient documentation

## 2021-11-17 DIAGNOSIS — Z79899 Other long term (current) drug therapy: Secondary | ICD-10-CM | POA: Insufficient documentation

## 2021-11-17 LAB — POCT URINE DRUG SCREEN - MANUAL ENTRY (I-SCREEN)
POC Amphetamine UR: NOT DETECTED
POC Buprenorphine (BUP): NOT DETECTED
POC Cocaine UR: NOT DETECTED
POC Marijuana UR: POSITIVE — AB
POC Methadone UR: NOT DETECTED
POC Methamphetamine UR: NOT DETECTED
POC Morphine: NOT DETECTED
POC Oxazepam (BZO): NOT DETECTED
POC Oxycodone UR: NOT DETECTED
POC Secobarbital (BAR): NOT DETECTED

## 2021-11-17 LAB — TSH: TSH: 0.671 u[IU]/mL (ref 0.350–4.500)

## 2021-11-17 LAB — CBC WITH DIFFERENTIAL/PLATELET
Abs Immature Granulocytes: 0.02 10*3/uL (ref 0.00–0.07)
Basophils Absolute: 0 10*3/uL (ref 0.0–0.1)
Basophils Relative: 0 %
Eosinophils Absolute: 0.1 10*3/uL (ref 0.0–0.5)
Eosinophils Relative: 1 %
HCT: 36.4 % (ref 36.0–46.0)
Hemoglobin: 12.3 g/dL (ref 12.0–15.0)
Immature Granulocytes: 0 %
Lymphocytes Relative: 40 %
Lymphs Abs: 2.3 10*3/uL (ref 0.7–4.0)
MCH: 30.5 pg (ref 26.0–34.0)
MCHC: 33.8 g/dL (ref 30.0–36.0)
MCV: 90.3 fL (ref 80.0–100.0)
Monocytes Absolute: 0.3 10*3/uL (ref 0.1–1.0)
Monocytes Relative: 5 %
Neutro Abs: 3.1 10*3/uL (ref 1.7–7.7)
Neutrophils Relative %: 54 %
Platelets: 232 10*3/uL (ref 150–400)
RBC: 4.03 MIL/uL (ref 3.87–5.11)
RDW: 12.6 % (ref 11.5–15.5)
WBC: 5.8 10*3/uL (ref 4.0–10.5)
nRBC: 0 % (ref 0.0–0.2)

## 2021-11-17 LAB — COMPREHENSIVE METABOLIC PANEL
ALT: 13 U/L (ref 0–44)
AST: 15 U/L (ref 15–41)
Albumin: 3.6 g/dL (ref 3.5–5.0)
Alkaline Phosphatase: 68 U/L (ref 38–126)
Anion gap: 7 (ref 5–15)
BUN: 7 mg/dL (ref 6–20)
CO2: 26 mmol/L (ref 22–32)
Calcium: 9.1 mg/dL (ref 8.9–10.3)
Chloride: 104 mmol/L (ref 98–111)
Creatinine, Ser: 0.73 mg/dL (ref 0.44–1.00)
GFR, Estimated: >60 mL/min (ref >60)
Glucose, Bld: 112 mg/dL — ABNORMAL HIGH (ref 70–99)
Potassium: 3.5 mmol/L (ref 3.5–5.1)
Sodium: 137 mmol/L (ref 135–145)
Total Bilirubin: 0.9 mg/dL (ref 0.3–1.2)
Total Protein: 7.2 g/dL (ref 6.5–8.1)

## 2021-11-17 LAB — HEMOGLOBIN A1C
Hgb A1c MFr Bld: 5.5 % (ref 4.8–5.6)
Mean Plasma Glucose: 111.15 mg/dL

## 2021-11-17 LAB — ETHANOL: Alcohol, Ethyl (B): <10 mg/dL (ref <10)

## 2021-11-17 LAB — LIPID PANEL
Cholesterol: 170 mg/dL (ref 0–200)
HDL: 53 mg/dL (ref >40)
LDL Cholesterol: 109 mg/dL — ABNORMAL HIGH (ref 0–99)
Total CHOL/HDL Ratio: 3.2 RATIO
Triglycerides: 39 mg/dL (ref <150)
VLDL: 8 mg/dL (ref 0–40)

## 2021-11-17 LAB — RESP PANEL BY RT-PCR (FLU A&B, COVID) ARPGX2
Influenza A by PCR: NEGATIVE
Influenza B by PCR: NEGATIVE
SARS Coronavirus 2 by RT PCR: POSITIVE — AB

## 2021-11-17 LAB — POC SARS CORONAVIRUS 2 AG -  ED: SARS Coronavirus 2 Ag: NEGATIVE

## 2021-11-17 LAB — POCT PREGNANCY, URINE: Preg Test, Ur: NEGATIVE

## 2021-11-17 LAB — MAGNESIUM: Magnesium: 1.7 mg/dL (ref 1.7–2.4)

## 2021-11-17 LAB — POC SARS CORONAVIRUS 2 AG: SARSCOV2ONAVIRUS 2 AG: NEGATIVE

## 2021-11-17 MED ORDER — HYDROXYZINE HCL 25 MG PO TABS: 25.0000 mg | ORAL_TABLET | Freq: Three times a day (TID) | ORAL | Status: DC | PRN

## 2021-11-17 MED ORDER — ESCITALOPRAM OXALATE 10 MG PO TABS: 10.0000 mg | ORAL_TABLET | Freq: Every day | ORAL | 0 refills | Status: DC

## 2021-11-17 MED ORDER — ESCITALOPRAM OXALATE 10 MG PO TABS
10.0000 mg | ORAL_TABLET | Freq: Every day | ORAL | Status: DC
  Administered 2021-11-17: 10 mg via ORAL
  Filled 2021-11-17: qty 1

## 2021-11-17 MED ORDER — TRAZODONE HCL 50 MG PO TABS: 50.0000 mg | ORAL_TABLET | Freq: Every evening | ORAL | Status: DC | PRN

## 2021-11-17 MED ORDER — ALUM & MAG HYDROXIDE-SIMETH 200-200-20 MG/5ML PO SUSP: 30.0000 mL | ORAL | Status: DC | PRN

## 2021-11-17 MED ORDER — MAGNESIUM HYDROXIDE 400 MG/5ML PO SUSP: 30.0000 mL | Freq: Every day | ORAL | Status: DC | PRN

## 2021-11-17 MED ORDER — ACETAMINOPHEN 325 MG PO TABS: 650.0000 mg | ORAL_TABLET | Freq: Four times a day (QID) | ORAL | Status: DC | PRN

## 2021-11-17 NOTE — ED Notes (Signed)
Completed ordered blood draw to right median antecubital vein. Pt tolerated well. Safety maintained.

## 2021-11-17 NOTE — ED Provider Notes (Signed)
FBC/OBS ASAP Discharge Summary  Date and Time: 11/17/2021 4:01 PM  Name: Kristine Garcia  MRN:  [id number]   Discharge Diagnoses:  Final diagnoses:  Severe episode of recurrent major depressive disorder, without psychotic features ([place])    Subjective: Patient states "I would rather go home, I feel safe to go there." Kristine Garcia was initially accepted for facility based crisis admission related to depressed mood.  She subsequently tested positive for COVID, she remains asymptomatic, unaware of any sick contacts. Patient is currently able to contract for safety with this Probation officer, denies suicidal homicidal ideations.  Safety planning completed, patient aware that she can return to emergency department if any worsening depressive symptoms or suicidal ideations.  She verbalizes understanding of this plan. Kristine Garcia will follow up with [facility] behavioral health through walk-in hours once COVID quarantine completed. Patient verbalizes readiness to discharge home. Patient offered support and encouragement.  She gives verbal consent to speak with her roommate who she describes as "like a mother to me." Spoke with roommate,Kristine Garcia [phone], who agrees with plan to discharge home and follow up with outpatient psychiatry.    Stay Summary: HPI from 1255pm today: Kristine Garcia is a 25 year old female.  Patient presents voluntarily to [facility] behavioral health for walk-in assessment.     Patient is assessed face-to-face by nurse practitioner. She is seated in assessment area, no acute distress.  She is alert and oriented, pleasant and cooperative during assessment.    Kristine Garcia reports feeling hopeless and overwhelmed.  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  She reports she needs new coping skills.  She is reports in the past she has used marijuana and prayer in an effort to cope.  She stopped  using marijuana 1 week ago.  She reports she feels unable to attend work related to her depressed mood.  She reports financial concerns and is very hopeful that she will not lose her job related to her depression.   Additional stressors include her father who was arrested today.  She reports concern for her 71 year old sister who resides with her father.  She states "I feel like I am failing, at this time I should have a home of my home and I should have been able to go and get her (37 year old sister).   She presents with depressed mood, congruent affect. She denies suicidal and homicidal ideations currently.  While she denies active suicidal ideation, she states "if somebody wanted to hurt me I would not stop them."  She endorses history of suicide attempts, 2 attempts total.  She reports most recent attempt at age 13 when she attempted an intentional overdose on medication.  She reports this was immediately after the death of her mother.  She has also displayed nonsuicidal self-harm behavior by cutting, last episode of cutting approximately 10 years ago.  She is unable to contract for safety at this time.   [name] is not currently linked with outpatient psychiatry.  She reports she met with a counselor while in college, diagnosed with depression at that time.  She denies any current medications.  She denies any history of psychotropic medications.  She denies any history of inpatient psychiatric hospitalizations.  She is unaware of any family history of mental illness.  Discussed antidepressant medication, escitalopram 10 mg daily, patient agrees with this plan.   She has normal speech and behavior.  She denies both auditory and visual hallucinations.  Patient is able to converse coherently with goal-directed thoughts and no distractibility or preoccupation.  She denies paranoia.  Objectively there is no evidence of psychosis/mania or delusional thinking.   [name] resides in [place] with a friend,  reports friend is "like a mother to me."  She denies access to weapons.  Patient endorses average sleep and decreased appetite.  She endorses history of chronic marijuana use, last marijuana use 1 week ago.  She denies alcohol use, she denies substance use aside from marijuana.   Patient offered support and encouragement.  Total Time spent with patient: 30 minutes  Past Psychiatric History: MDD Past Medical History:  Past Medical History:  Diagnosis Date   Known health problems: none    Vitamin D deficiency     Past Surgical History:  Procedure Laterality Date   NO PAST SURGERIES     Family History:  Family History  Problem Relation Age of Onset   Hypertension Mother    Hypertension Father    Sickle cell trait Maternal Grandmother    Family Psychiatric History: None reported Social History:  Social History   Substance and Sexual Activity  Alcohol Use Yes   Comment: social     Social History   Substance and Sexual Activity  Drug Use Yes   Types: Marijuana   Comment: occ    Social History   Socioeconomic History   Marital status: Single    Spouse name: Not on file   Number of children: Not on file   Years of education: Not on file   Highest education level: Not on file  Occupational History   Not on file  Tobacco Use   Smoking status: Never   Smokeless tobacco: Never  Vaping Use   Vaping Use: Never used  Substance and Sexual Activity   Alcohol use: Yes    Comment: social   Drug use: Yes    Types: Marijuana    Comment: occ   Sexual activity: Not Currently    Birth control/protection: None  Other Topics Concern   Not on file  Social History Narrative   Not on file   Social Determinants of Health   Financial Resource Strain: Not on file  Food Insecurity: Not on file  Transportation Needs: Not on file  Physical Activity: Not on file  Stress: Not on file  Social Connections: Not on file   SDOH:  SDOH Screenings   Alcohol Screen: Not on file   Depression (PHQ2-9): Not on file  Financial Resource Strain: Not on file  Food Insecurity: Not on file  Housing: Not on file  Physical Activity: Not on file  Social Connections: Not on file  Stress: Not on file  Tobacco Use: Low Risk    Smoking Tobacco Use: Never   Smokeless Tobacco Use: Never   Passive Exposure: Not on file  Transportation Needs: Not on file    Tobacco Cessation:  N/A, patient does not currently use tobacco products  Current Medications:  Current Facility-Administered Medications  Medication Dose Route Frequency Provider Last Rate Last Admin   acetaminophen (TYLENOL) tablet 650 mg  650 mg Oral Q6H PRN Lucky Rathke, FNP       alum & mag hydroxide-simeth (MAALOX/MYLANTA) 200-200-20 MG/5ML suspension 30 mL  30 mL Oral Q4H PRN Lucky Rathke, FNP       escitalopram (LEXAPRO) tablet 10 mg  10 mg Oral Daily Lucky Rathke, FNP   10 mg at  11/17/21 1341   hydrOXYzine (ATARAX) tablet 25 mg  25 mg Oral TID PRN Lucky Rathke, FNP       magnesium hydroxide (MILK OF MAGNESIA) suspension 30 mL  30 mL Oral Daily PRN Lucky Rathke, FNP       traZODone (DESYREL) tablet 50 mg  50 mg Oral QHS PRN Lucky Rathke, FNP       Current Outpatient Medications  Medication Sig Dispense Refill   Cholecalciferol (VITAMIN D3 GUMMIES ADULT PO) Take 2 tablets by mouth daily.     naproxen sodium (ALEVE) 220 MG tablet Take 220 mg by mouth 2 (two) times daily as needed (For pain.).      PTA Medications: (Not in a hospital admission)   Musculoskeletal  Strength & Muscle Tone: within normal limits Gait & Station: normal Patient leans: N/A  Psychiatric Specialty Exam  Presentation  General Appearance: Appropriate for Environment; Casual  Eye Contact:Fair  Speech:Clear and Coherent; Normal Rate  Speech Volume:Normal  Handedness:Right   Mood and Affect  Mood:Depressed  Affect:Depressed; Tearful   Thought Process  Thought Processes:Coherent; Goal Directed; Linear  Descriptions  of Associations:Intact  Orientation:Full (Time, Place and Person)  Thought Content:Logical; WDL  Diagnosis of Schizophrenia or Schizoaffective disorder in past: No    Hallucinations:Hallucinations: None  Ideas of Reference:None  Suicidal Thoughts:Suicidal Thoughts: No  Homicidal Thoughts:Homicidal Thoughts: No   Sensorium  Memory:Immediate Good; Recent Good; Remote Good  Judgment:Good  Insight:Fair   Executive Functions  Concentration:Good  Attention Span:Good  [place] of Knowledge:Good  Language:Good   Psychomotor Activity  Psychomotor Activity:Psychomotor Activity: Normal   Assets  Assets:Communication Skills; Desire for Improvement; Housing; Physical Health; Resilience; Social Support   Sleep  Sleep:Sleep: Fair   No data recorded  Physical Exam  Physical Exam Vitals and nursing note reviewed.  Constitutional:      Appearance: Normal appearance. She is well-developed.  HENT:     Head: Normocephalic and atraumatic.     Nose: Nose normal.  Cardiovascular:     Rate and Rhythm: Normal rate.  Pulmonary:     Effort: Pulmonary effort is normal.  Musculoskeletal:        General: Normal range of motion.     Cervical back: Normal range of motion.  Skin:    General: Skin is warm and dry.  Neurological:     Mental Status: She is alert and oriented to person, place, and time.  Psychiatric:        Attention and Perception: Attention and perception normal.        Mood and Affect: Affect normal. Mood is depressed.        Speech: Speech normal.        Behavior: Behavior normal. Behavior is cooperative.        Thought Content: Thought content normal.        Cognition and Memory: Cognition and memory normal.        Judgment: Judgment normal.   Review of Systems  Constitutional: Negative.   HENT: Negative.    Eyes: Negative.   Respiratory: Negative.    Cardiovascular: Negative.   Gastrointestinal: Negative.   Genitourinary: Negative.    Musculoskeletal: Negative.   Skin: Negative.   Neurological: Negative.   Endo/Heme/Allergies: Negative.   Psychiatric/Behavioral:  Positive for depression.   Blood pressure 119/74, pulse 61, temperature 98.8 F (37.1 C), temperature source Oral, resp. rate 16, SpO2 100 %. There is no height or weight on file to calculate BMI.  Demographic  Factors:  NA  Loss Factors: NA  Historical Factors: Prior suicide attempts  Risk Reduction Factors:   Sense of responsibility to family, Employed, Living with another person, especially a relative, Positive social support, Positive therapeutic relationship, and Positive coping skills or problem solving skills  Continued Clinical Symptoms:  Previous Psychiatric Diagnoses and Treatments  Cognitive Features That Contribute To Risk:  None    Suicide Risk:  Minimal: No identifiable suicidal ideation.  Patients presenting with no risk factors but with morbid ruminations; may be classified as minimal risk based on the severity of the depressive symptoms  Plan Of Care/Follow-up recommendations:  Patient reviewed with Dr. Serafina Mitchell. Follow-up with outpatient psychiatry, resources provided. Medication: -Escitalopram 10 mg daily/mood.  Disposition: Discharge  Lucky Rathke, FNP 11/17/2021, 4:01 PM

## 2021-11-17 NOTE — BH Assessment (Signed)
Pt presents to [facility] with worsening depression symptoms. Pt states that she has been "feeling off lately". Pt states that she has been struggling with depression symptoms for a while and her symptoms have recently gotten worse after starting a new job recently. 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 Pt states that her only support system is her friend but does not want to continue to put stress on her friend and possibly lose this friend. Pt states that she has financial stress. Pt states that she has been feeling like she doesn't want to be here.Pt has passive SI at the moment but does not have a plan but states "if someone was to try and hurt me I wouldn't stop them". Pt is tearful. Pt denies HI and AVH at this time. Pt is urgent.

## 2021-11-17 NOTE — ED Provider Notes (Signed)
Behavioral Health Admission H&P [facility] & OBS)  Date: 11/17/21 Patient Name: Kristine Garcia MRN: [id number] Chief Complaint:  Chief Complaint  Patient presents with   Depression      Diagnoses:  Final diagnoses:  Severe episode of recurrent major depressive disorder, without psychotic features [facility])    HPI: Kristine Garcia is a 25 year old female.  Patient presents voluntarily to [facility] behavioral health for walk-in assessment.    Patient is assessed face-to-face by nurse practitioner. She is seated in assessment area, no acute distress.  She is alert and oriented, pleasant and cooperative during assessment.   Kristine Garcia reports feeling hopeless and overwhelmed.  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  She reports she needs new coping skills.  She is reports in the past she has used marijuana and prayer in an effort to cope.  She stopped using marijuana 1 week ago.  She reports she feels unable to attend work related to her depressed mood.  She reports financial concerns and is very hopeful that she will not lose her job related to her depression.  Additional stressors include her father who was arrested today.  She reports concern for her 65 year old sister who resides with her father.  She states "I feel like I am failing, at this time I should have a home of my home and I should have been able to go and get her (56 year old sister).  She presents with depressed mood, congruent affect. She denies suicidal and homicidal ideations currently.  While she denies active suicidal ideation, she states "if somebody wanted to hurt me I would not stop them."  She endorses history of suicide attempts, 2 attempts total.  She reports most recent attempt at age 50 when she attempted an intentional overdose on medication.  She reports this was immediately after the death of her mother.  She has also  displayed nonsuicidal self-harm behavior by cutting, last episode of cutting approximately 10 years ago.  She is unable to contract for safety at this time.  Kristine Garcia is not currently linked with outpatient psychiatry.  She reports she met with a counselor while in college, diagnosed with depression at that time.  She denies any current medications.  She denies any history of psychotropic medications.  She denies any history of inpatient psychiatric hospitalizations.  She is unaware of any family history of mental illness.  Discussed antidepressant medication, escitalopram 10 mg daily, patient agrees with this plan.  She has normal speech and behavior.  She denies both auditory and visual hallucinations.  Patient is able to converse coherently with goal-directed thoughts and no distractibility or preoccupation.  She denies paranoia.  Objectively there is no evidence of psychosis/mania or delusional thinking.  Rhetta resides in [place] with a friend, reports friend is "like a mother to me."  She denies access to weapons.  Patient endorses average sleep and decreased appetite.  She endorses history of chronic marijuana use, last marijuana use 1 week ago.  She denies alcohol use, she denies substance use aside from marijuana.  Patient offered support and encouragement.   PHQ 2-9:   [place] ED from 09/10/2021 in [facility] at [facility] ED from 04/03/2021 in [facility] No Risk No Risk        Total Time spent with patient: 30 minutes  Musculoskeletal  Strength & Muscle Tone: within normal limits Gait & Station: normal Patient leans: N/A  Psychiatric Specialty Exam  Presentation General Appearance: Appropriate for Environment; Casual  Eye Contact:Fair  Speech:Clear and Coherent; Normal Rate  Speech Volume:Normal  Handedness:Right   Mood and Affect  Mood:Depressed  Affect:Depressed; Tearful   Thought  Process  Thought Processes:Coherent; Goal Directed; Linear  Descriptions of Associations:Intact  Orientation:Full (Time, Place and Person)  Thought Content:Logical; WDL    Hallucinations:Hallucinations: None  Ideas of Reference:None  Suicidal Thoughts:Suicidal Thoughts: No  Homicidal Thoughts:Homicidal Thoughts: No   Sensorium  Memory:Immediate Good; Recent Good; Remote Good  Judgment:Good  Insight:Fair   Executive Functions  Concentration:Good  Attention Span:Good  Kristine Garcia of Knowledge:Good  Language:Good   Psychomotor Activity  Psychomotor Activity:Psychomotor Activity: Normal   Assets  Assets:Communication Skills; Desire for Improvement; Housing; Physical Health; Resilience; Social Support   Sleep  Sleep:Sleep: Fair   No data recorded  Physical Exam ROS  Blood pressure 119/74, pulse 61, temperature 98.8 F (37.1 C), temperature source Oral, resp. rate 16, SpO2 100 %. There is no height or weight on file to calculate BMI.  Past Psychiatric History: Depression  Is the patient at risk to self? No  Has the patient been a risk to self in the past 6 months? No .    Has the patient been a risk to self within the distant past? Yes   Is the patient a risk to others? No   Has the patient been a risk to others in the past 6 months? No   Has the patient been a risk to others within the distant past? No   Past Medical History:  Past Medical History:  Diagnosis Date   Known health problems: none    Vitamin D deficiency     Past Surgical History:  Procedure Laterality Date   NO PAST SURGERIES      Family History:  Family History  Problem Relation Age of Onset   Hypertension Mother    Hypertension Father    Sickle cell trait Maternal Grandmother     Social History:  Social History   Socioeconomic History   Marital status: Single    Spouse name: Not on file   Number of children: Not on file   Years of education: Not on file    Highest education level: Not on file  Occupational History   Not on file  Tobacco Use   Smoking status: Never   Smokeless tobacco: Never  Vaping Use   Vaping Use: Never used  Substance and Sexual Activity   Alcohol use: Yes    Comment: social   Drug use: Yes    Types: Marijuana    Comment: occ   Sexual activity: Not Currently    Birth control/protection: None  Other Topics Concern   Not on file  Social History Narrative   Not on file   Social Determinants of Health   Financial Resource Strain: Not on file  Food Insecurity: Not on file  Transportation Needs: Not on file  Physical Activity: Not on file  Stress: Not on file  Social Connections: Not on file  Intimate Partner Violence: Not on file    SDOH:  SDOH Screenings   Alcohol Screen: Not on file  Depression (YQM5-7): Not on file  Financial Resource Strain: Not on file  Food Insecurity: Not on file  Housing: Not on file  Physical Activity: Not on file  Social Connections: Not on file  Stress: Not on  file  Tobacco Use: Low Risk    Smoking Tobacco Use: Never   Smokeless Tobacco Use: Never   Passive Exposure: Not on file  Transportation Needs: Not on file    Last Labs:  Admission on 11/17/2021  Component Date Value Ref Range Status   POC Amphetamine UR 11/17/2021 None Detected  NONE DETECTED (Cut Off Level 1000 ng/mL) Final   POC Secobarbital (BAR) 11/17/2021 None Detected  NONE DETECTED (Cut Off Level 300 ng/mL) Final   POC Buprenorphine (BUP) 11/17/2021 None Detected  NONE DETECTED (Cut Off Level 10 ng/mL) Final   POC Oxazepam (BZO) 11/17/2021 None Detected  NONE DETECTED (Cut Off Level 300 ng/mL) Final   POC Cocaine UR 11/17/2021 None Detected  NONE DETECTED (Cut Off Level 300 ng/mL) Final   POC Methamphetamine UR 11/17/2021 None Detected  NONE DETECTED (Cut Off Level 1000 ng/mL) Final   POC Morphine 11/17/2021 None Detected  NONE DETECTED (Cut Off Level 300 ng/mL) Final   POC Oxycodone UR 11/17/2021 None  Detected  NONE DETECTED (Cut Off Level 100 ng/mL) Final   POC Methadone UR 11/17/2021 None Detected  NONE DETECTED (Cut Off Level 300 ng/mL) Final   POC Marijuana UR 11/17/2021 Positive (A)  NONE DETECTED (Cut Off Level 50 ng/mL) Final   SARS Coronavirus 2 Ag 11/17/2021 Negative  Negative Final   SARSCOV2ONAVIRUS 2 AG 11/17/2021 NEGATIVE  NEGATIVE Final   Comment: (NOTE) SARS-CoV-2 antigen NOT DETECTED.   Negative results are presumptive.  Negative results do not preclude SARS-CoV-2 infection and should not be used as the sole basis for treatment or other patient management decisions, including infection  control decisions, particularly in the presence of clinical signs and  symptoms consistent with COVID-19, or in those who have been in contact with the virus.  Negative results must be combined with clinical observations, patient history, and epidemiological information. The expected result is Negative.  Fact Sheet for Patients: HandmadeRecipes.com.cy  Fact Sheet for Healthcare Providers: FuneralLife.at  This test is not yet approved or cleared by the [place] FDA and  has been authorized for detection and/or diagnosis of SARS-CoV-2 by FDA under an Emergency Use Authorization (EUA).  This EUA will remain in effect (meaning this test can be used) for the duration of  the COV                          ID-19 declaration under Section 564(b)(1) of the Act, 21 U.S.C. section 360bbb-3(b)(1), unless the authorization is terminated or revoked sooner.      Allergies: Patient has no known allergies.  PTA Medications: (Not in a hospital admission)   Medical Decision Making  Patient reviewed with Dr. Serafina Mitchell.  She will be admitted to the facility based crisis unit at [facility] health for treatment and stabilization.  Patient remains voluntary at this time. Laboratory studies ordered including CBC, CMP, ethanol, A1c,  hepatic function, lipid panel, magnesium and TSH.  Urine pregnancy and urine drug screen ordered.  EKG order initiated.  Current medications: -Acetaminophen 650 mg every 6 as needed/mild pain -Maalox 30 mL oral every 4 as needed/digestion -Hydroxyzine 25 mg 3 times daily as needed/anxiety -Magnesium hydroxide 30 mL daily as needed/mild constipation -Trazodone 50 mg nightly as needed/sleep -Escitalopram 48m daily/mood      Recommendations  Based on my evaluation the patient does not appear to have an emergency medical condition.  TLucky Rathke FNP 11/17/21  1:25 PM

## 2021-11-17 NOTE — ED Notes (Signed)
Discharge instructions provided and Pt stated understanding. Pt alert, orient and ambulatory prior to d/c from facility. Personal belongings returned from locker number 17. Pt escorted to the front lobby to d/c home. Safety maintained.

## 2021-11-17 NOTE — Discharge Instructions (Addendum)
[facility]- Outpatient 2nd Floor  New Patient Therapy Walk-ins: Monday-Wednesday: 8am until slots are full.  Please arrive by 7:30 am, patients will be seen in the order of arrival.  Friday: 1pm-5pm Please arrive by 12-12:30pm   New Patient Medication Management Walk-ins: Monday-Friday: 8am-11am.  Please arrive by 7:30am, patients will be seen in the order of arrival.   Please Note: Availability is limited, you may not be seen on the same day that you walk-in. Our goal is to serve and meet the needs of our community to the best of our ability.       Patient is instructed prior to discharge to:  Take all medications as prescribed by his/her mental healthcare provider. Report any adverse effects and or reactions from the medicines to his/her outpatient provider promptly. Keep all scheduled appointments, to ensure that you are getting refills on time and to avoid any interruption in your medication.  If you are unable to keep an appointment call to reschedule.  Be sure to follow-up with resources and follow-up appointments provided.  Patient has been instructed & cautioned: To not engage in alcohol and or illegal drug use while on prescription medicines. In the event of worsening symptoms, patient is instructed to call the crisis hotline, 911 and or go to the nearest ED for appropriate evaluation and treatment of symptoms. To follow-up with his/her primary care provider for your other medical issues, concerns and or health care needs.    

## 2021-11-17 NOTE — BH Assessment (Signed)
Comprehensive Clinical Assessment (CCA) Note  11/17/2021 Kristine Garcia [id number]  Disposition: Per Beatriz Stallion, NP, patient is recommended admission to [facility].   [place] ED from 11/17/2021 in [facility] ED from 09/10/2021 in [facility] at [facility] ED from 04/03/2021 in [place] Moderate Risk No Risk No Risk      The patient demonstrates the following risk factors for suicide: Chronic risk factors for suicide include: psychiatric disorder of MDD, substance use disorder, previous suicide attempts x2, and previous self-harm cutting years ago . Acute risk factors for suicide include: family or marital conflict and loss (financial, interpersonal, professional). Protective factors for this patient include: responsibility to others (children, family). Considering these factors, the overall suicide risk at this point appears to be moderate. Patient is not appropriate for outpatient follow up.  Kristine Garcia is a 25 year old female presenting to [facility] with worsening depression symptoms. Patient reports depressive symptoms has been going on for a while and her symptoms have recently gotten worse after starting a new job recently. 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 Patient reports having financial stress and reports her mother died about 10 years ago and ongoing family issues. Patient states that she has been feeling like she doesn't want to be alive anymore. Patient has passive SI without a plan however reports "if someone was to try and hurt me I wouldn't stop them". Patient does not have outpatient services and denies a history of inpatient treatment.   Patient is oriented to person, place and situation. Patient eye contact and speech is normal. Patient affect is depressed  with congruent mood. Patient denies active SI however, she can not contract for safety. Patient denies HI, AVH and reports history of THC use and SIB.   Chief Complaint:  Chief Complaint  Patient presents with   Depression   Visit Diagnosis: Severe episode of recurrent major depressive disorder, without psychotic features ([place])    CCA Screening, Triage and Referral (STR)  Patient Reported Information How did you hear about [place]? Self  What Is the Reason for Your Visit/Call Today? Pt presents to [facility] with worsening depression symptoms.  How Long Has This Been Causing You Problems? 1 wk - 1 month  What Do You Feel Would Help You the Most Today? Treatment for Depression or other mood problem   Have You Recently Had Any Thoughts About Hurting Yourself? Yes  Are You Planning to Commit Suicide/Harm Yourself At This time? No   Have you Recently Had Thoughts About [place]? No  Are You Planning to Harm Someone at This Time? No  Explanation: No data recorded  Have You Used Any Alcohol or Drugs in the Past 24 Hours? No  How Long Ago Did You Use Drugs or Alcohol? No data recorded What Did You Use and How Much? No data recorded  Do You Currently Have a Therapist/Psychiatrist? No  Name of Therapist/Psychiatrist: No data recorded  Have You Been Recently Discharged From Any Office Practice or Programs? No  Explanation of Discharge From Practice/Program: No data recorded    CCA Screening Triage Referral Assessment Type of Contact: Face-to-Face  Telemedicine Service Delivery:   Is this Initial or Reassessment? No data recorded Date Telepsych consult ordered in CHL:  No data recorded Time Telepsych consult ordered in  CHL:  No data recorded Location of Assessment: [facility]  Provider Location: [facility] Assessment Services   Collateral Involvement: No data recorded  Does Patient Have a Stage manager Guardian? No data recorded Name and Contact  of Legal Guardian: No data recorded If Minor and Not Living with Parent(s), Who has Custody? No data recorded Is CPS involved or ever been involved? No data recorded Is APS involved or ever been involved? No data recorded  Patient Determined To Be At Risk for Harm To Self or Others Based on Review of Patient Reported Information or Presenting Complaint? Yes, for Self-Harm  Method: No data recorded Availability of Means: No data recorded Intent: No data recorded Notification Required: No data recorded Additional Information for Danger to Others Potential: No data recorded Additional Comments for Danger to Others Potential: No data recorded Are There Guns or Other Weapons in Your Home? No data recorded Types of Guns/Weapons: No data recorded Are These Weapons Safely Secured?                            No data recorded Who Could Verify You Are Able To Have These Secured: No data recorded Do You Have any Outstanding Charges, Pending Court Dates, Parole/Probation? No data recorded Contacted To Inform of Risk of Harm To Self or Others: No data recorded   Does Patient Present under Involuntary Commitment? No  IVC Papers Initial File Date: No data recorded  [place] of Residence: [place]   Patient Currently Receiving the Following Services: No data recorded  Determination of Need: Urgent (48 hours)   Options For Referral: Outpatient Therapy; Medication Management; Other: Comment     CCA Biopsychosocial Patient Reported Schizophrenia/Schizoaffective Diagnosis in Past: No   Strengths: No data recorded  Mental Health Symptoms Depression:   Change in energy/activity; Difficulty Concentrating; Hopelessness; Irritability; Sleep (too much or little); Tearfulness   Duration of Depressive symptoms:  Duration of Depressive Symptoms: Greater than two weeks   Mania:   N/A   Anxiety:    Worrying; Tension   Psychosis:   None   Duration of Psychotic symptoms:    Trauma:   N/A    Obsessions:   N/A   Compulsions:   N/A   Inattention:   N/A   Hyperactivity/Impulsivity:   N/A   Oppositional/Defiant Behaviors:   N/A   Emotional Irregularity:   None   Other Mood/Personality Symptoms:  No data recorded   Mental Status Exam Appearance and self-care  Stature:   Tall   Weight:   Overweight   Clothing:   Age-appropriate; Neat/clean   Grooming:   Normal   Cosmetic use:   Age appropriate   Posture/gait:   Normal   Motor activity:   Not Remarkable   Sensorium  Attention:   Normal   Concentration:   Normal   Orientation:   X5   Recall/memory:   Normal   Affect and Mood  Affect:   Depressed; Tearful; Congruent   Mood:   Depressed   Relating  Eye contact:   Normal   Facial expression:   Depressed; Sad   Attitude toward examiner:   Cooperative   Thought and Language  Speech flow:  Clear and Coherent   Thought content:   Appropriate to Mood and Circumstances   Preoccupation:   None   Hallucinations:   None   Organization:  No data recorded  [employer] of Knowledge:  Good   Intelligence:   Average   Abstraction:   Normal   Judgement:   Good   Reality Testing:   Adequate   Insight:   Good   Decision Making:   Normal   Social Functioning  Social Maturity:   Responsible   Social Judgement:   Normal   Stress  Stressors:   Grief/losses; Financial; Work   Coping Ability:   Overwhelmed; Exhausted   Skill Deficits:   None   Supports:   Support needed; Friends/Service system     Religion:    Leisure/Recreation:    Exercise/Diet: Exercise/Diet Do You Have Any Trouble Sleeping?: Yes   CCA Employment/Education Employment/Work 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: Yes  Education:     CCA Family/Childhood History Family and Relationship History: Family history Marital status: Single  Childhood  History:  Childhood History By whom was/is the patient raised?: Mother  Child/Adolescent Assessment:     CCA Substance Use Alcohol/Drug Use: Alcohol / Drug Use Pain Medications: See MAR Prescriptions: See MAR Over the Counter: See MAR History of alcohol / drug use?: Yes Substance #1 Name of Substance 1: THC                       ASAM's:  Six Dimensions of Multidimensional Assessment  Dimension 1:  Acute Intoxication and/or Withdrawal Potential:      Dimension 2:  Biomedical Conditions and Complications:      Dimension 3:  Emotional, Behavioral, or Cognitive Conditions and Complications:     Dimension 4:  Readiness to Change:     Dimension 5:  Relapse, Continued use, or Continued Problem Potential:     Dimension 6:  Recovery/Living Environment:     ASAM Severity Score:    ASAM Recommended Level of Treatment:     Substance use Disorder (SUD)    Recommendations for Services/Supports/Treatments: Recommendations for Services/Supports/Treatments Recommendations For Services/Supports/Treatments: Individual Therapy  Discharge Disposition:    DSM5 Diagnoses: Patient Active Problem List   Diagnosis Date Noted   MDD (major depressive disorder), recurrent severe, without psychosis (HCC) 11/17/2021   Vitamin D deficiency 09/24/2019     Referrals to Alternative Service(s): Referred to Alternative Service(s):   Place:   Date:   Time:    Referred to Alternative Service(s):   Place:   Date:   Time:    Referred to Alternative Service(s):   Place:   Date:   Time:    Referred to Alternative Service(s):   Place:   Date:   Time:     Audree Camel, [facility]

## 2021-11-18 LAB — PROLACTIN: Prolactin: 14.9 ng/mL (ref 4.8–23.3)

## 2021-11-28 ENCOUNTER — Telehealth (HOSPITAL_COMMUNITY): Payer: Self-pay

## 2021-11-28 NOTE — BH Assessment (Signed)
Care Management - BHUC Follow Up Discharges  ° °Writer attempted to make contact with patient today and was unsuccessful.  Writer left a HIPPA compliant voice message.  ° °Per chart review, patient was provided with outpatient resources. ° °

## 2021-12-07 IMAGING — CR DG KNEE COMPLETE 4+V*L*
4 series · 4 of 4 positions shown · non-contrast
Comparison: None.

CLINICAL DATA: Right patellar pain for 3 days.  No known injury.

EXAM:
LEFT KNEE - COMPLETE 4+ VIEW

[knee ap]
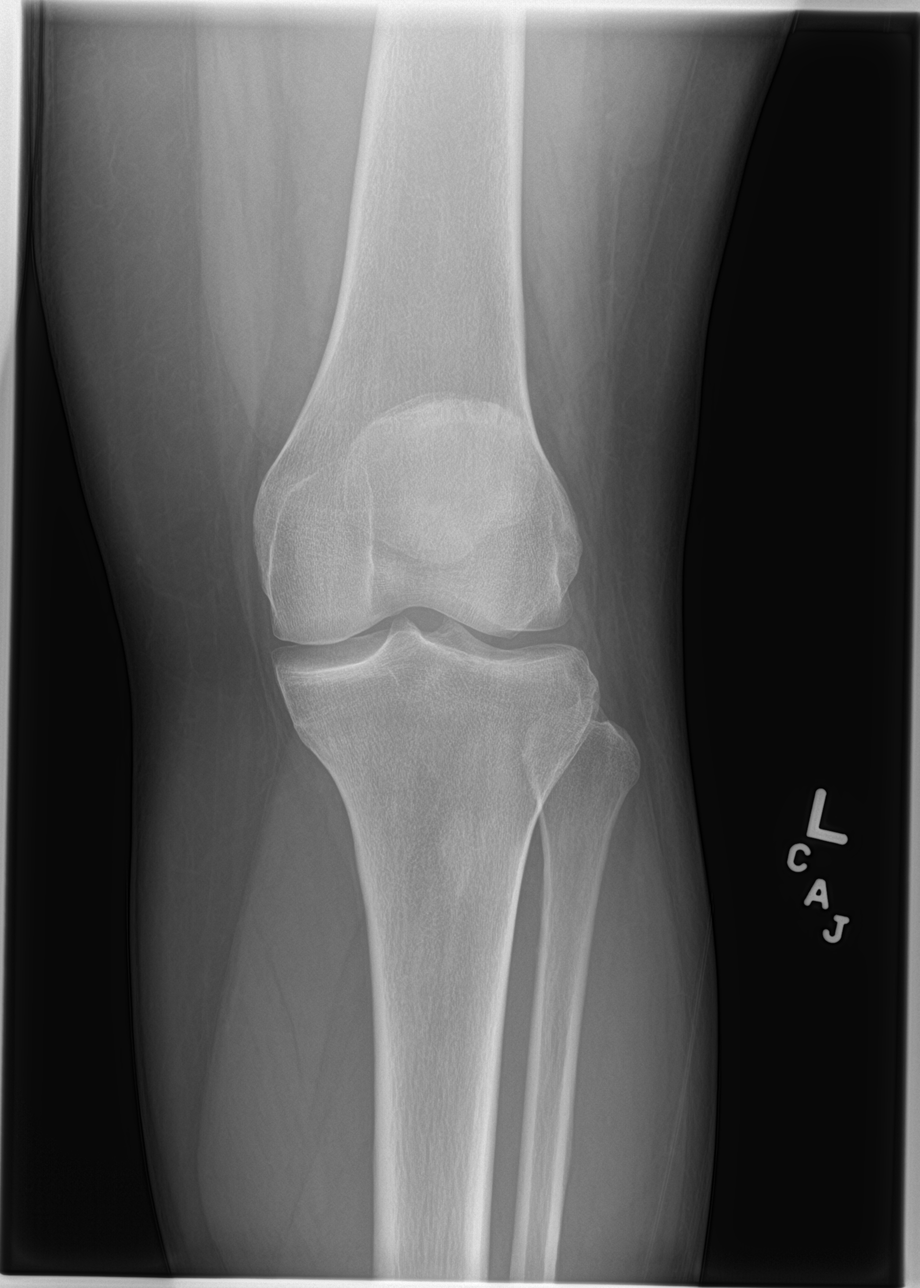

[knee lat]
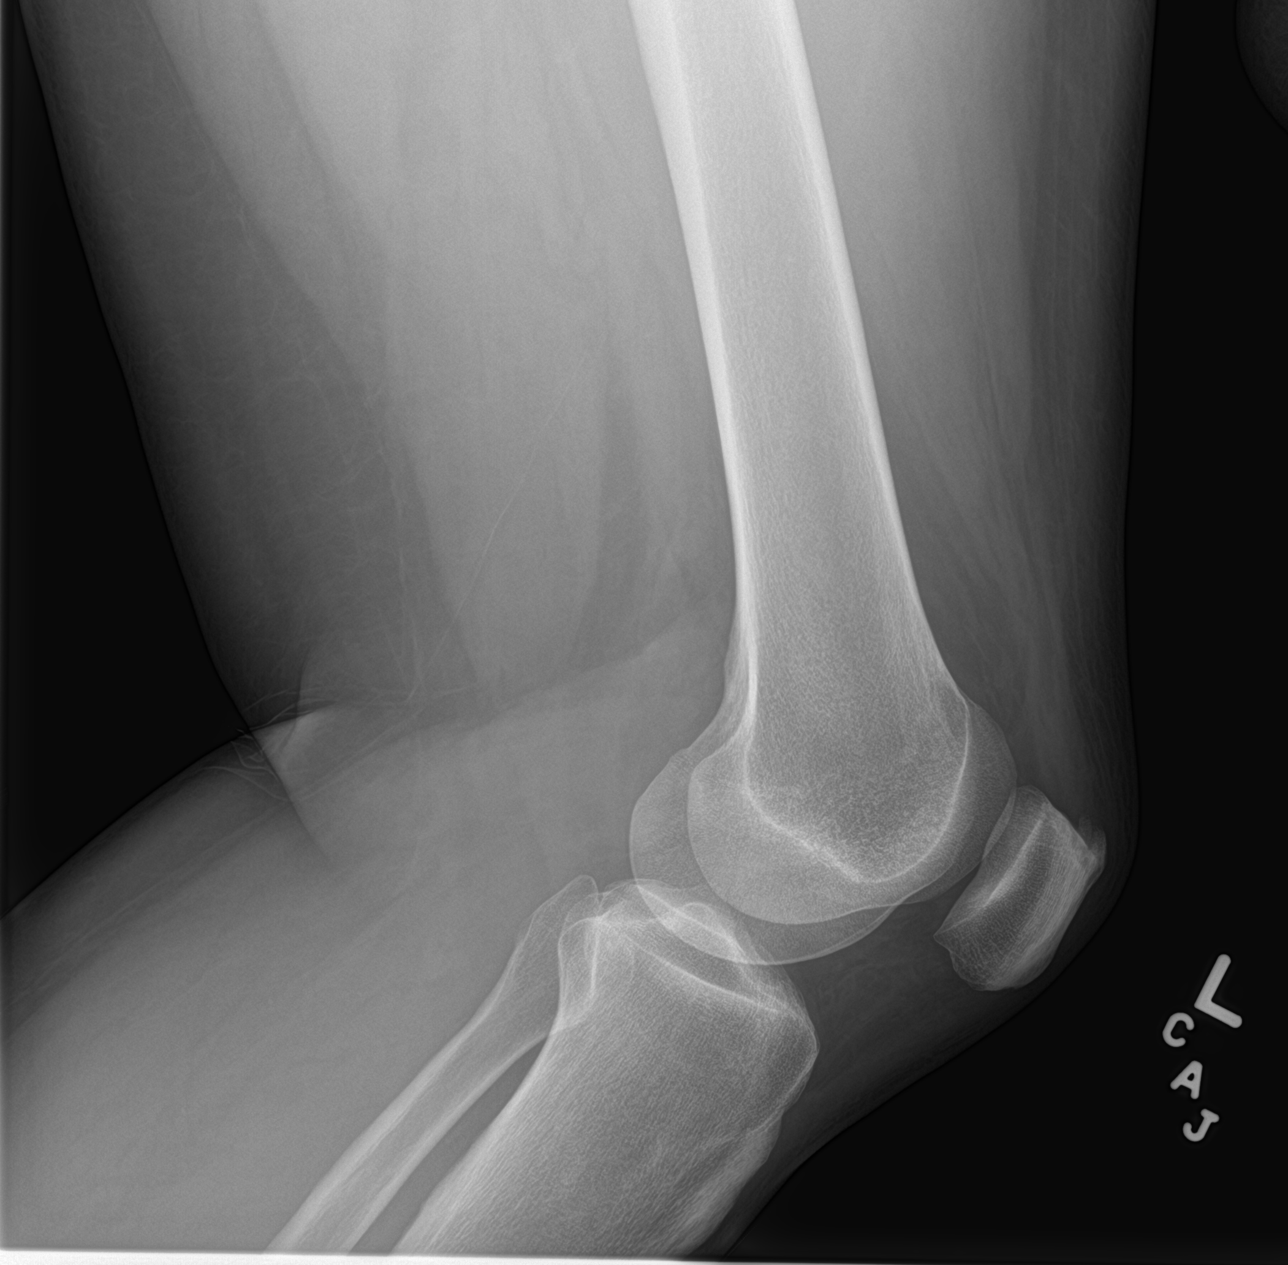

[knee obl (1 of 2)]
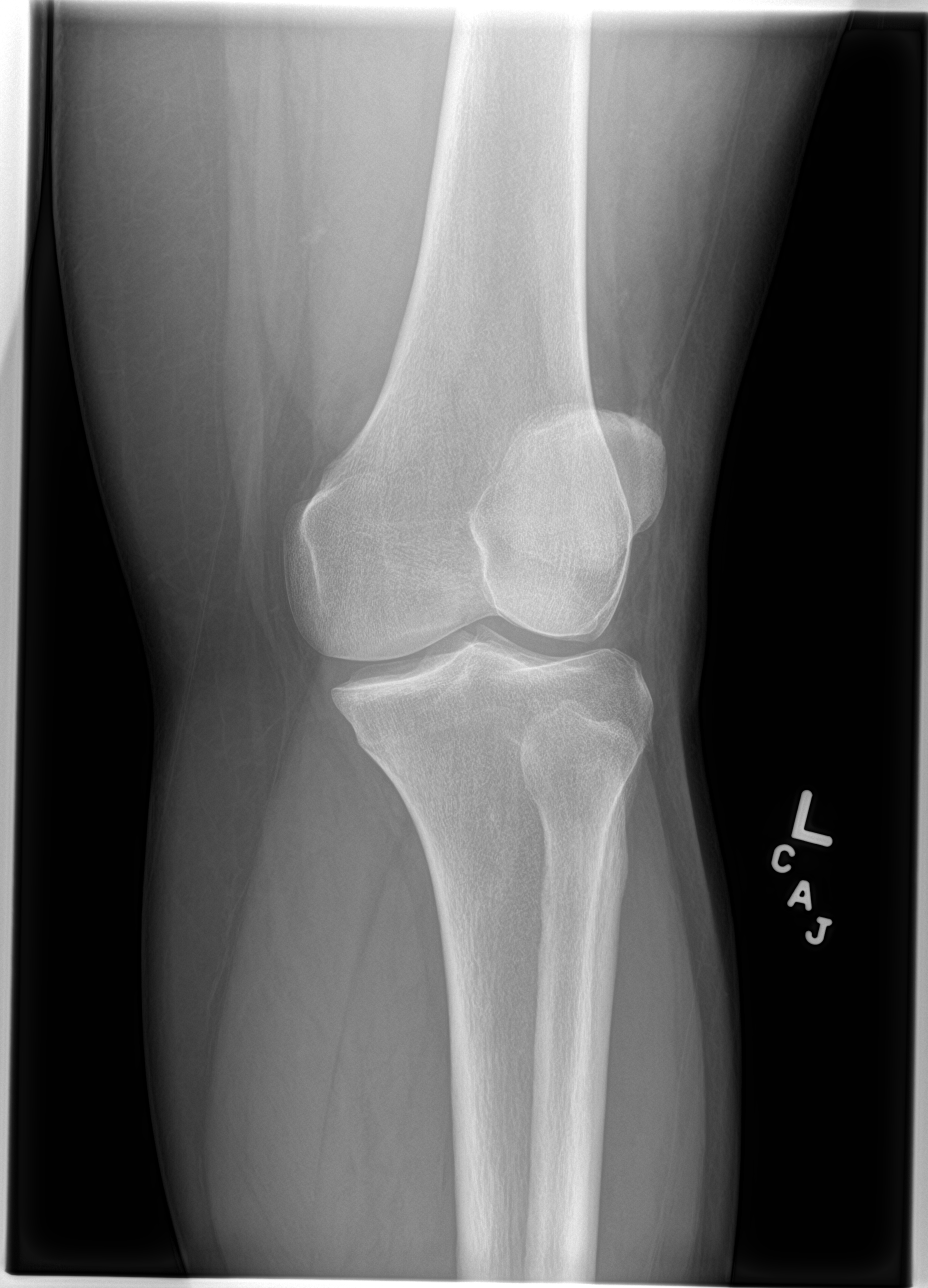

[knee obl (2 of 2)]
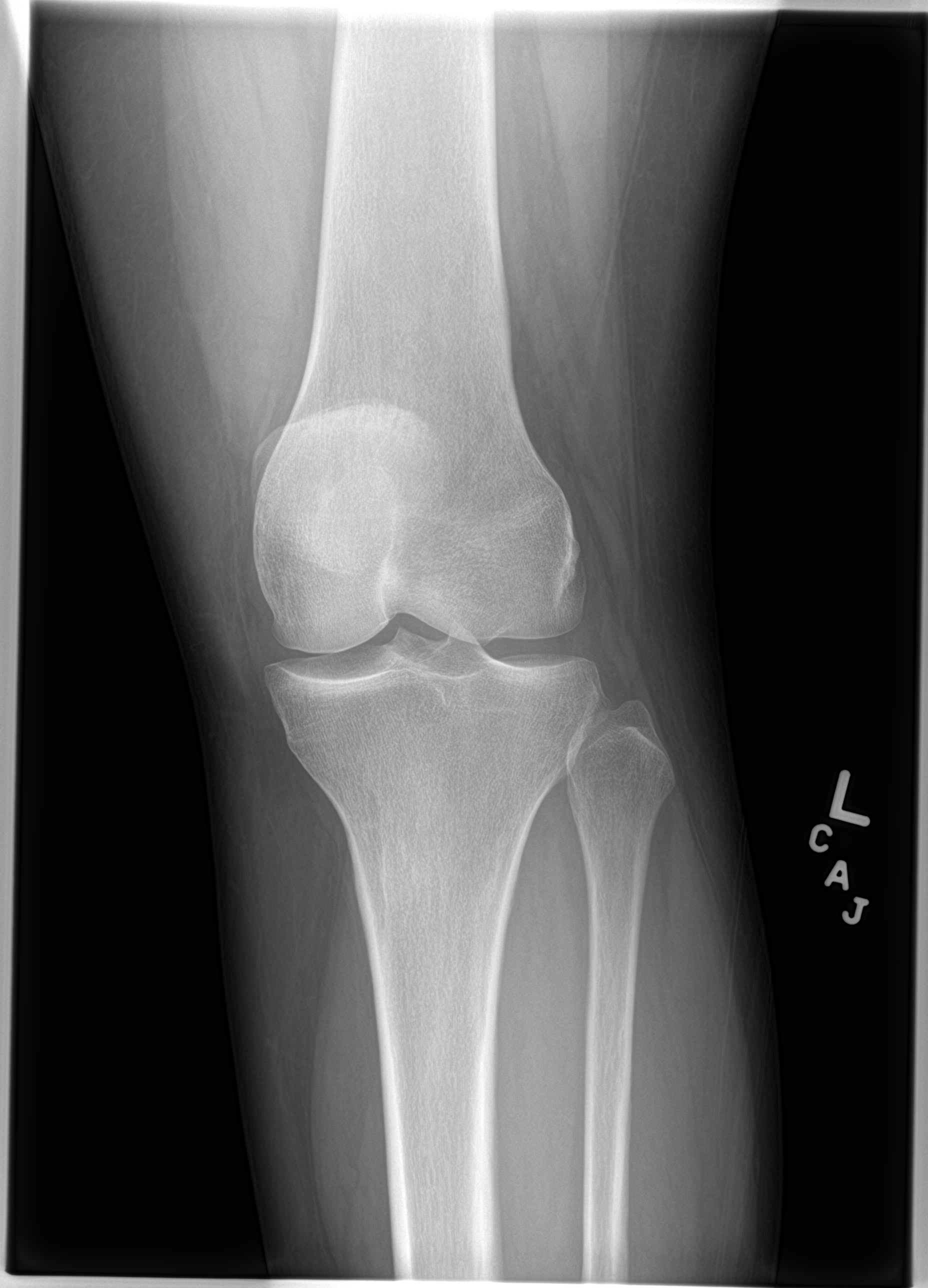

[4 of 4 positions shown; findings below may reference images not displayed]

FINDINGS: No evidence of fracture, dislocation, or joint effusion. No evidence
of arthropathy or other focal bone abnormality. Soft tissues are
unremarkable.
IMPRESSION: Normal exam.

## 2022-05-24 ENCOUNTER — Encounter (HOSPITAL_COMMUNITY): Payer: Self-pay

## 2022-05-24 ENCOUNTER — Emergency Department (HOSPITAL_COMMUNITY)
Admission: EM | Admit: 2022-05-24 | Discharge: 2022-05-24 | Disposition: A | Discharge status: Home / Self Care | Payer: Commercial Managed Care - PPO | Attending: Student | Admitting: Student

## 2022-05-24 ENCOUNTER — Other Ambulatory Visit: Payer: Self-pay

## 2022-05-24 DIAGNOSIS — K0889 Other specified disorders of teeth and supporting structures: Secondary | ICD-10-CM | POA: Insufficient documentation

## 2022-05-24 MED ORDER — AMOXICILLIN-POT CLAVULANATE 875-125 MG PO TABS
1.0000 | ORAL_TABLET | Freq: Once | ORAL | Status: AC
  Administered 2022-05-24: 1 via ORAL
  Filled 2022-05-24: qty 1

## 2022-05-24 MED ORDER — NAPROXEN 500 MG PO TBEC: 500.0000 mg | DELAYED_RELEASE_TABLET | Freq: Two times a day (BID) | ORAL | 0 refills | Status: DC | PRN

## 2022-05-24 MED ORDER — AMOXICILLIN-POT CLAVULANATE 875-125 MG PO TABS: 1.0000 | ORAL_TABLET | Freq: Two times a day (BID) | ORAL | 0 refills | Status: DC

## 2022-05-24 MED ORDER — NAPROXEN 250 MG PO TABS
500.0000 mg | ORAL_TABLET | Freq: Once | ORAL | Status: AC
Start: 2022-05-24 — End: 2022-05-24
  Administered 2022-05-24: 500 mg via ORAL
  Filled 2022-05-24: qty 2

## 2022-05-24 NOTE — ED Provider Notes (Signed)
[facility] EMERGENCY DEPARTMENT Provider Note   CSN: [id number] Arrival date & time: 05/24/22  0444     History  Chief Complaint  Patient presents with   Dental Pain    Kristine Garcia is a 26 y.o. female without significant pmhx who presents to the ED with complaints of right upper dental pain that has worsened over the past few days. Patient reports she has had ongoing pain to right upper molars for awhile now, worse over past few days, tried tylenol without relief. Scheduled to see a dentist tomorrow morning. Denies fever, vomiting, or dysphagia.   HPI     Home Medications Prior to Admission medications   Medication Sig Start Date End Date Taking? Authorizing Provider  Cholecalciferol (VITAMIN D3 GUMMIES ADULT PO) Take 2 tablets by mouth daily.    [provider]  escitalopram (LEXAPRO) 10 MG tablet Take 1 tablet (10 mg total) by mouth daily. 11/18/21   Lenard Lance, FNP      Allergies    Patient has no known allergies.    Review of Systems   Review of Systems  Constitutional:  Negative for chills and fever.  HENT:  Positive for dental problem. Negative for trouble swallowing.   Respiratory:  Negative for shortness of breath.   Cardiovascular:  Negative for chest pain.  Gastrointestinal:  Negative for abdominal pain and vomiting.  Neurological:  Negative for syncope.  All other systems reviewed and are negative.   Physical Exam Updated Vital Signs BP 136/83 (BP Location: Right Arm)   Pulse 77   Temp 99.1 F (37.3 C) (Oral)   Resp 18   SpO2 97%  Physical Exam Vitals and nursing note reviewed.  Constitutional:      General: She is not in acute distress.    Appearance: She is well-developed. She is not toxic-appearing.  HENT:     Head: Normocephalic and atraumatic.     Right Ear: Tympanic membrane is not perforated, erythematous, retracted or bulging.     Left Ear: Tympanic membrane is not perforated, erythematous, retracted or  bulging.     Nose: Nose normal.     Mouth/Throat:     Pharynx: Uvula midline. No oropharyngeal exudate, posterior oropharyngeal erythema or uvula swelling.     Tonsils: No tonsillar abscesses.     Comments: Some areas of dental decay noted.  Right upper gingiva with swelling and erythema- tenderness to palpation. No fluctuance.  Eyes:     General:        Right eye: No discharge.        Left eye: No discharge.     Conjunctiva/sclera: Conjunctivae normal.  Cardiovascular:     Rate and Rhythm: Normal rate and regular rhythm.  Pulmonary:     Effort: No respiratory distress.     Breath sounds: Normal breath sounds. No wheezing or rales.  Abdominal:     General: There is no distension.     Palpations: Abdomen is soft.     Tenderness: There is no abdominal tenderness.  Musculoskeletal:     Cervical back: Normal range of motion and neck supple.  Lymphadenopathy:     Cervical: No cervical adenopathy.  Skin:    General: Skin is warm and dry.  Neurological:     Mental Status: She is alert.     Comments: Clear speech.   Psychiatric:        Behavior: Behavior normal.        Thought Content: Thought content normal.  ED Results / Procedures / Treatments   Labs (all labs ordered are listed, but only abnormal results are displayed) Labs Reviewed - No data to display  EKG None  Radiology No results found.  Procedures Procedures    Medications Ordered in ED Medications  amoxicillin-clavulanate (AUGMENTIN) 875-125 MG per tablet 1 tablet (has no administration in time range)  naproxen (NAPROSYN) tablet 500 mg (has no administration in time range)    ED Course/ Medical Decision Making/ A&P                           Medical Decision Making Risk Prescription drug management.  Patient presents with dental pain. Patient is nontoxic appearing, vitals without significant abnormality. No gross abscess.  Exam unconcerning for Ludwig's angina or deep space infection at this time.   Will treat with Augmentin and Naproxen.  Urged patient to follow-up with dentist as scheduled tomorrow, additional dental resources were provided.  Discussed treatment plan and need for follow up as well as return precautions. Provided opportunity for questions, patient confirmed understanding and is agreeable to plan.   Final Clinical Impression(s) / ED Diagnoses Final diagnoses:  Pain, dental    Rx / DC Orders ED Discharge Orders          Ordered    amoxicillin-clavulanate (AUGMENTIN) 875-125 MG tablet  Every 12 hours        05/24/22 0502    naproxen (EC-NAPROSYN) 500 MG EC tablet  2 times daily PRN        05/24/22 0502              Gladis Soley, [place] R, PA-C 05/24/22 0547    Gilda Crease, MD 05/24/22 [phone]

## 2022-05-24 NOTE — ED Triage Notes (Signed)
Pt arrived POV from home c/o dental pain that has been ongoing for awhile but progressively worse over the last 3 days.

## 2022-05-24 NOTE — Discharge Instructions (Addendum)
Call one of the dentists offices provided to schedule an appointment for re-evaluation and further management within the next 48 hours.  ° °I have prescribed you Augmentin which is an antibiotic to treat the infection and Naproxen which is an anti-inflammatory medicine to treat the pain.  ° °Please take all of your antibiotics until finished. You may develop abdominal discomfort or diarrhea from the antibiotic.  You may help offset this with probiotics which you can buy at the store (ask your pharmacist if unable to find) or get probiotics in the form of eating yogurt. Do not eat or take the probiotics until 2 hours after your antibiotic. If you are unable to tolerate these side effects follow-up with your primary care provider or return to the emergency department.  ° °If you begin to experience any blistering, rashes, swelling, or difficulty breathing seek medical care for evaluation of potentially more serious side effects.  ° °Be sure to eat something when taking the Naproxen as it can cause stomach upset and at worst stomach bleeding. Do not take additional non steroidal anti-inflammatory medicines such as Ibuprofen, Aleve, Advil, Mobic, Diclofenac, or goodie powder while taking Naproxen. You may supplement with Tylenol.  ° °We have prescribed you new medication(s) today. Discuss the medications prescribed today with your pharmacist as they can have adverse effects and interactions with your other medicines including over the counter and prescribed medications. Seek medical evaluation if you start to experience new or abnormal symptoms after taking one of these medicines, seek care immediately if you start to experience difficulty breathing, feeling of your throat closing, facial swelling, or rash as these could be indications of a more serious allergic reaction ° °If you start to experience and new or worsening symptoms return to the emergency department. If you start to experience fever, chills, neck  stiffness/pain, or inability to move your neck or open your mouth come back to the emergency department immediately.  ° °

## 2022-06-05 ENCOUNTER — Encounter (HOSPITAL_COMMUNITY): Payer: Self-pay

## 2022-06-05 ENCOUNTER — Ambulatory Visit (HOSPITAL_COMMUNITY)
Admission: EM | Admit: 2022-06-05 | Discharge: 2022-06-05 | Disposition: A | Discharge status: Home / Self Care | Payer: Commercial Managed Care - PPO

## 2022-06-05 DIAGNOSIS — K0889 Other specified disorders of teeth and supporting structures: Secondary | ICD-10-CM | POA: Diagnosis not present

## 2022-06-05 NOTE — ED Provider Notes (Signed)
[facility]    CSN: [id number] Arrival date & time: 06/05/22  1740      History   Chief Complaint Chief Complaint  Patient presents with   Sinus Problem    HPI Kristine Garcia is a 26 y.o. female.   Pt with known dental problems, currently on Augmentin, presents with complaints of sinus pressure, rhinorrhea, and headaches.  He has been experiencing dental related pain for the last three weeks.  Reports several days of rhinorrhea, and fever.  He has reports he has a follow up with a dentist. Denies cough, wheezing, shortness of breath.      Past Medical History:  Diagnosis Date   Known health problems: none    Vitamin D deficiency     Patient Active Problem List   Diagnosis Date Noted   MDD (major depressive disorder), recurrent severe, without psychosis (HCC) 11/17/2021   Vitamin D deficiency 09/24/2019    Past Surgical History:  Procedure Laterality Date   NO PAST SURGERIES      OB History   No obstetric history on file.      Home Medications    Prior to Admission medications   Medication Sig Start Date End Date Taking? Authorizing Provider  amoxicillin-clavulanate (AUGMENTIN) 875-125 MG tablet Take 1 tablet by mouth every 12 (twelve) hours. 05/24/22  Yes Petrucelli, Samantha R, PA-C  Cholecalciferol (VITAMIN D3 GUMMIES ADULT PO) Take 2 tablets by mouth daily.   Yes [provider]  naproxen (EC-NAPROSYN) 500 MG EC tablet Take 1 tablet (500 mg total) by mouth 2 (two) times daily as needed (pain). 05/24/22  Yes Petrucelli, Pleas Koch, PA-C    Family History Family History  Problem Relation Age of Onset   Hypertension Mother    Hypertension Father    Sickle cell trait Maternal Grandmother     Social History Social History   Tobacco Use   Smoking status: Some Days    Types: Cigars   Smokeless tobacco: Never  Vaping Use   Vaping Use: Never used  Substance Use Topics   Alcohol use: Yes    Comment: social   Drug use: Yes    Types:  Marijuana    Comment: occ     Allergies   Patient has no known allergies.   Review of Systems Review of Systems  Constitutional:  Positive for fever. Negative for chills.  HENT:  Positive for congestion, dental problem, sinus pressure and sinus pain. Negative for ear pain and sore throat.   Eyes:  Negative for pain and visual disturbance.  Respiratory:  Negative for cough and shortness of breath.   Cardiovascular:  Negative for chest pain and palpitations.  Gastrointestinal:  Negative for abdominal pain and vomiting.  Genitourinary:  Negative for dysuria and hematuria.  Musculoskeletal:  Negative for arthralgias and back pain.  Skin:  Negative for color change and rash.  Neurological:  Negative for seizures and syncope.  All other systems reviewed and are negative.    Physical Exam Triage Vital Signs ED Triage Vitals  Enc Vitals Group     BP 06/05/22 1824 120/84     Pulse Rate 06/05/22 1824 73     Resp 06/05/22 1824 16     Temp 06/05/22 1824 98.2 F (36.8 C)     Temp Source 06/05/22 1824 Oral     SpO2 06/05/22 1824 98 %     Weight 06/05/22 1823 255 lb (115.7 kg)     Height 06/05/22 1823 5\' 8"  (1.727 m)  Head Circumference --      Peak Flow --      Pain Score 06/05/22 1822 9     Pain Loc --      Pain Edu? --      Excl. in GC? --    No data found.  Updated Vital Signs BP 120/84 (BP Location: Left Arm)   Pulse 73   Temp 98.2 F (36.8 C) (Oral)   Resp 16   Ht 5\' 8"  (1.727 m)   Wt 255 lb (115.7 kg)   LMP 05/22/2022 (Within Days)   SpO2 98%   BMI 38.77 kg/m   Visual Acuity Right Eye Distance:   Left Eye Distance:   Bilateral Distance:    Right Eye Near:   Left Eye Near:    Bilateral Near:     Physical Exam Vitals and nursing note reviewed.  Constitutional:      General: She is not in acute distress.    Appearance: She is well-developed.  HENT:     Head: Normocephalic and atraumatic.  Eyes:     Conjunctiva/sclera: Conjunctivae normal.   Cardiovascular:     Rate and Rhythm: Normal rate and regular rhythm.     Heart sounds: No murmur heard. Pulmonary:     Effort: Pulmonary effort is normal. No respiratory distress.     Breath sounds: Normal breath sounds.  Abdominal:     Palpations: Abdomen is soft.     Tenderness: There is no abdominal tenderness.  Musculoskeletal:        General: No swelling.     Cervical back: Neck supple.  Skin:    General: Skin is warm and dry.     Capillary Refill: Capillary refill takes less than 2 seconds.  Neurological:     Mental Status: She is alert.  Psychiatric:        Mood and Affect: Mood normal.      UC Treatments / Results  Labs (all labs ordered are listed, but only abnormal results are displayed) Labs Reviewed - No data to display  EKG   Radiology No results found.  Procedures Procedures (including critical care time)  Medications Ordered in UC Medications - No data to display  Initial Impression / Assessment and Plan / UC Course  I have reviewed the triage vital signs and the nursing notes.  Pertinent labs & imaging results that were available during my care of the patient were reviewed by me and considered in my medical decision making (see chart for details).     Dental pain. He is also experiencing URI.  Advised to continue Augmentin as prescribed.  Supportive care discussed.  Advised to keep follow up with dentist.  Return precautions discussed.  Final Clinical Impressions(s) / UC Diagnoses   Final diagnoses:  Pain, dental     Discharge Instructions      Recommend Tylenol as needed, can alternate with NSAID.  Do not take more than 4000 mg of Tylenol per day.  For sinus pain and pressure can try Flonase and Mucinex Apply cold compress to left face Continue antibiotic as it has been prescribed Keep follow up with dentist   ED Prescriptions   None    PDMP not reviewed this encounter.   Ward, 05/24/2022, PA-C 06/13/22 1721

## 2022-06-05 NOTE — Discharge Instructions (Signed)
Recommend Tylenol as needed, can alternate with NSAID.  Do not take more than 4000 mg of Tylenol per day.  For sinus pain and pressure can try Flonase and Mucinex Apply cold compress to left face Continue antibiotic as it has been prescribed Keep follow up with dentist

## 2022-06-05 NOTE — ED Triage Notes (Signed)
Onset of sinus symptoms 3 weeks ago. Nose bleeds, sinus pressure, runny nose, and headaches. Patient had a fever at 101.5 but has gone down.   Patient has a ongoing oral infection as well.

## 2022-06-15 ENCOUNTER — Emergency Department (HOSPITAL_COMMUNITY)
Admission: EM | Admit: 2022-06-15 | Discharge: 2022-06-15 | Disposition: A | Discharge status: Home / Self Care | Payer: Commercial Managed Care - PPO | Attending: Emergency Medicine | Admitting: Emergency Medicine

## 2022-06-15 ENCOUNTER — Encounter (HOSPITAL_COMMUNITY): Payer: Self-pay | Admitting: Emergency Medicine

## 2022-06-15 ENCOUNTER — Other Ambulatory Visit: Payer: Self-pay

## 2022-06-15 DIAGNOSIS — M542 Cervicalgia: Secondary | ICD-10-CM | POA: Diagnosis present

## 2022-06-15 DIAGNOSIS — M549 Dorsalgia, unspecified: Secondary | ICD-10-CM | POA: Diagnosis not present

## 2022-06-15 DIAGNOSIS — Y92410 Unspecified street and highway as the place of occurrence of the external cause: Secondary | ICD-10-CM | POA: Diagnosis not present
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
## 2022-06-15 NOTE — Discharge Instructions (Addendum)
You can take Tylenol in addition to the naproxen for muscle pain.  Continue taking the naproxen for your wisdom teeth as well as to help with any muscle aches.  Return to the emergency department if you have any difficulty using the restroom, numbness in your pelvis, loss of consciousness, new or concerning symptoms.  I suspect you are going to have some muscle aches over the next few weeks, you may hurt in new places like your back, your legs, your neck.  This is normal.  Follow-up with your primary if the pain persists.

## 2022-07-08 ENCOUNTER — Other Ambulatory Visit: Payer: Self-pay

## 2022-07-08 ENCOUNTER — Encounter (HOSPITAL_COMMUNITY): Payer: Self-pay

## 2022-07-08 ENCOUNTER — Emergency Department (HOSPITAL_COMMUNITY)
Admission: EM | Admit: 2022-07-08 | Discharge: 2022-07-08 | Disposition: A | Discharge status: Home / Self Care | Payer: Commercial Managed Care - PPO | Attending: Emergency Medicine | Admitting: Emergency Medicine

## 2022-07-08 DIAGNOSIS — R1084 Generalized abdominal pain: Secondary | ICD-10-CM

## 2022-07-08 DIAGNOSIS — K529 Noninfective gastroenteritis and colitis, unspecified: Secondary | ICD-10-CM

## 2022-07-08 DIAGNOSIS — A09 Infectious gastroenteritis and colitis, unspecified: Secondary | ICD-10-CM | POA: Insufficient documentation

## 2022-07-08 LAB — COMPREHENSIVE METABOLIC PANEL
ALT: 16 U/L (ref 0–44)
AST: 18 U/L (ref 15–41)
Albumin: 4 g/dL (ref 3.5–5.0)
Alkaline Phosphatase: 64 U/L (ref 38–126)
Anion gap: 8 (ref 5–15)
BUN: 9 mg/dL (ref 6–20)
CO2: 24 mmol/L (ref 22–32)
Calcium: 9.7 mg/dL (ref 8.9–10.3)
Chloride: 109 mmol/L (ref 98–111)
Creatinine, Ser: 0.78 mg/dL (ref 0.44–1.00)
GFR, Estimated: >60 mL/min (ref >60)
Glucose, Bld: 130 mg/dL — ABNORMAL HIGH (ref 70–99)
Potassium: 3.3 mmol/L — ABNORMAL LOW (ref 3.5–5.1)
Sodium: 141 mmol/L (ref 135–145)
Total Bilirubin: 1.3 mg/dL — ABNORMAL HIGH (ref 0.3–1.2)
Total Protein: 8.1 g/dL (ref 6.5–8.1)

## 2022-07-08 LAB — URINALYSIS, ROUTINE W REFLEX MICROSCOPIC
Bilirubin Urine: NEGATIVE
Glucose, UA: NEGATIVE mg/dL
Hgb urine dipstick: NEGATIVE
Ketones, ur: 5 mg/dL — AB
Nitrite: NEGATIVE
Protein, ur: 30 mg/dL — AB
Specific Gravity, Urine: 1.027 (ref 1.005–1.030)
pH: 5 (ref 5.0–8.0)

## 2022-07-08 LAB — CBC
HCT: 40.4 % (ref 36.0–46.0)
Hemoglobin: 13.5 g/dL (ref 12.0–15.0)
MCH: 30.5 pg (ref 26.0–34.0)
MCHC: 33.4 g/dL (ref 30.0–36.0)
MCV: 91.4 fL (ref 80.0–100.0)
Platelets: 193 10*3/uL (ref 150–400)
RBC: 4.42 MIL/uL (ref 3.87–5.11)
RDW: 13.4 % (ref 11.5–15.5)
WBC: 13.5 10*3/uL — ABNORMAL HIGH (ref 4.0–10.5)
nRBC: 0 % (ref 0.0–0.2)

## 2022-07-08 LAB — LIPASE, BLOOD: Lipase: 22 U/L (ref 11–51)

## 2022-07-08 LAB — PREGNANCY, URINE: Preg Test, Ur: NEGATIVE

## 2022-07-08 MED ORDER — ONDANSETRON HCL 4 MG PO TABS: 4.0000 mg | ORAL_TABLET | Freq: Four times a day (QID) | ORAL | 0 refills | Status: DC

## 2022-07-08 MED ORDER — DICYCLOMINE HCL 10 MG PO CAPS
10.0000 mg | ORAL_CAPSULE | Freq: Once | ORAL | Status: AC
Start: 2022-07-08 — End: 2022-07-08
  Administered 2022-07-08: 10 mg via ORAL
  Filled 2022-07-08: qty 1

## 2022-07-08 MED ORDER — LACTATED RINGERS IV BOLUS
1000.0000 mL | Freq: Once | INTRAVENOUS | Status: AC
Start: 2022-07-08 — End: 2022-07-08
  Administered 2022-07-08: 1000 mL via INTRAVENOUS

## 2022-07-08 MED ORDER — DICYCLOMINE HCL 20 MG PO TABS: 20.0000 mg | ORAL_TABLET | Freq: Two times a day (BID) | ORAL | 0 refills | Status: DC | PRN

## 2022-07-08 MED ORDER — METOCLOPRAMIDE HCL 5 MG/ML IJ SOLN
5.0000 mg | Freq: Once | INTRAMUSCULAR | Status: AC
  Administered 2022-07-08: 5 mg via INTRAVENOUS
  Filled 2022-07-08: qty 2

## 2022-07-08 MED ORDER — KETOROLAC TROMETHAMINE 30 MG/ML IJ SOLN
15.0000 mg | Freq: Once | INTRAMUSCULAR | Status: AC
  Administered 2022-07-08: 15 mg via INTRAVENOUS
  Filled 2022-07-08: qty 1

## 2022-07-08 NOTE — ED Notes (Signed)
Pt was able to tolerate drink and food, no n/v at this time

## 2022-07-08 NOTE — ED Triage Notes (Signed)
Pt c/o N/V/D since eating a cheese and egg biscuit this morning at 0500

## 2022-07-08 NOTE — Discharge Instructions (Signed)
You have been seen in the Emergency Department (ED)  today for nausea and vomiting.  Your work up today has not shown a clear cause for your symptoms, but they may be due to a viral infection or food poisoning. You have been prescribed Zofran; please use as prescribed as needed for your nausea. You have also been prescribed bentyl to take as needed for abdominal cramping which you can take with Tylenol.  Follow up with your doctor as soon as possible, ideally within one week, regarding today's emergent visit and your symptoms of nausea/vomiting.   Return to the Emergency Department (ED)  if you develop severe abdominal pain, bloody vomiting, bloody diarrhea, if you are unable to tolerate fluids due to vomiting, or if you develop other symptoms that concern you.

## 2022-07-08 NOTE — ED Provider Notes (Signed)
[facility] DE[facility] Provider Note   CSN: [id number] Arrival date & time: 07/08/22  1645     History  Chief Complaint  Patient presents with   Emesis    Kristine Garcia is a 26 y.o. female.  With PMH of depression who presents with abdominal pain, nausea, vomiting and diarrhea since this morning. She started feeling nauseous yesterday and then had an egg sandwich this morning followed by multiple episodes nbnb emesis. She has also had runny nonbloody stools. Endorses associated diffuse cramping abdominal pain.  Denies any sick contacts, fevers, chest pain, URI symptoms, UTI symptoms, abnormal GU discharge burning or itching.  She has never had abdominal surgery in the past.  She was given Zofran with EMS on her way here which is stopped her vomiting. Denies any drug or alcohol use or marijuana  use.   Emesis      Home Medications Prior to Admission medications   Medication Sig Start Date End Date Taking? Authorizing Provider  dicyclomine (BENTYL) 20 MG tablet Take 1 tablet (20 mg total) by mouth 2 (two) times daily as needed for spasms (abdominal cramping). 07/08/22  Yes Mardene Sayer, MD  ondansetron (ZOFRAN) 4 MG tablet Take 1 tablet (4 mg total) by mouth every 6 (six) hours. 07/08/22  Yes Mardene Sayer, MD  amoxicillin-clavulanate (AUGMENTIN) 875-125 MG tablet Take 1 tablet by mouth every 12 (twelve) hours. 05/24/22   Petrucelli, Pleas Koch, PA-C  Cholecalciferol (VITAMIN D3 GUMMIES ADULT PO) Take 2 tablets by mouth daily.    [provider]  naproxen (EC-NAPROSYN) 500 MG EC tablet Take 1 tablet (500 mg total) by mouth 2 (two) times daily as needed (pain). 05/24/22   Petrucelli, Pleas Koch, PA-C      Allergies    Patient has no known allergies.    Review of Systems   Review of Systems  Gastrointestinal:  Positive for vomiting.    Physical Exam Updated Vital Signs BP (!) 150/89   Pulse 80   Temp 98.1 F (36.7 C) (Oral)   Resp 16    Ht 5\' 8"  (1.727 m)   Wt 115.7 kg   SpO2 100%   BMI 38.78 kg/m  Physical Exam Constitutional: Alert and oriented.  Holding emesis bag and slightly uncomfortable but not actively vomiting.  Eyes: Conjunctivae are normal. ENT      Head: Normocephalic and atraumatic.      Nose: No congestion.      Mouth/Throat: Mucous membranes are moist.      Neck: No stridor. Cardiovascular: S1, S2,  Normal and symmetric distal pulses are present in all extremities.Warm and well perfused. Respiratory: Normal respiratory effort. Breath sounds are normal. Gastrointestinal: Soft and reported diffuse tenderness below no localization, no rebound, no guarding, no CVAT Musculoskeletal: Normal range of motion in all extremities. Neurologic: Normal speech and language. No gross focal neurologic deficits are appreciated. Skin: Skin is warm, dry and intact. No rash noted. Psychiatric: Mood and affect are normal. Speech and behavior are normal.   ED Results / Procedures / Treatments   Labs (all labs ordered are listed, but only abnormal results are displayed) Labs Reviewed  COMPREHENSIVE METABOLIC PANEL - Abnormal; Notable for the following components:      Result Value   Potassium 3.3 (*)    Glucose, Bld 130 (*)    Total Bilirubin 1.3 (*)    All other components within normal limits  CBC - Abnormal; Notable for the following components:   WBC 13.5 (*)  All other components within normal limits  URINALYSIS, ROUTINE W REFLEX MICROSCOPIC - Abnormal; Notable for the following components:   Color, Urine AMBER (*)    APPearance CLOUDY (*)    Ketones, ur 5 (*)    Protein, ur 30 (*)    Leukocytes,Ua TRACE (*)    Bacteria, UA RARE (*)    All other components within normal limits  LIPASE, BLOOD  PREGNANCY, URINE    EKG None  Radiology No results found.  Procedures Procedures  Remained on constant cardiac monitoring, normal sinus rhythm.  Medications Ordered in ED Medications  lactated ringers  bolus 1,000 mL (0 mLs Intravenous Stopped 07/08/22 1844)  metoCLOPramide (REGLAN) injection 5 mg (5 mg Intravenous Given 07/08/22 1743)  dicyclomine (BENTYL) capsule 10 mg (10 mg Oral Given 07/08/22 1743)  ketorolac (TORADOL) 30 MG/ML injection 15 mg (15 mg Intravenous Given 07/08/22 1743)    ED Course/ Medical Decision Making/ A&P Clinical Course as of 07/08/22 1906  Sat Jul 08, 2022  1901 Kristine Garcia is a 26 y.o. female.  With PMH of depression who presents with abdominal pain, nausea, vomiting and diarrhea since this morning.  Suspect patient's presentation most consistent with viral gastroenteritis.  Benign abdominal exam with no localization, unlikely to be appendicitis or diverticulitis or cholecystitis, holding off from imaging at this time as patient improved and was able to tolerate p.o. after medications.  Patient's work-up with leukocytosis 13.5 likely resulting from vomiting and suspected gastroenteritis.  Glucose 130.  Creatinine 0.78.  Slight bump in total bilirubin 1.3 but no transaminitis normal lipase, not consistent with pancreatitis and no right upper quadrant pain concerning for cholelithiasis or cholecystitis.  Pregnancy test negative.  UA with ketones and protein suggestive of mild dehydration, 11-20 WBCs with rare bacteria but no urinary symptoms, will hold off from treating for UTI at this time and allow for culture to result.  Suspect patient's symptoms gastroenteritis.  Repeat abdominal exam benign.  She is tolerating p.o. without vomiting.  She feels safe with discharge home.  Prescribed Zofran and Bentyl as needed for symptoms.  Strict return precaution discussed.  She is in agreement with plan. [VB]    Clinical Course User Index [VB] Mardene Sayer, MD                           Final Clinical Impression(s) / ED Diagnoses Final diagnoses:  Gastroenteritis  Generalized abdominal pain    Rx / DC Orders ED Discharge Orders          Ordered    ondansetron (ZOFRAN) 4  MG tablet  Every 6 hours        07/08/22 1901    dicyclomine (BENTYL) 20 MG tablet  2 times daily PRN        07/08/22 1901              Mardene Sayer, MD 07/08/22 1906

## 2022-09-15 ENCOUNTER — Ambulatory Visit (HOSPITAL_COMMUNITY)
Admission: EM | Admit: 2022-09-15 | Discharge: 2022-09-17 | Disposition: A | Discharge status: Home / Self Care | Payer: No Payment, Other | Attending: Psychiatry | Admitting: Psychiatry

## 2022-09-15 DIAGNOSIS — Z62810 Personal history of physical and sexual abuse in childhood: Secondary | ICD-10-CM | POA: Insufficient documentation

## 2022-09-15 DIAGNOSIS — F332 Major depressive disorder, recurrent severe without psychotic features: Secondary | ICD-10-CM

## 2022-09-15 DIAGNOSIS — Z9151 Personal history of suicidal behavior: Secondary | ICD-10-CM | POA: Insufficient documentation

## 2022-09-15 DIAGNOSIS — R443 Hallucinations, unspecified: Secondary | ICD-10-CM

## 2022-09-15 DIAGNOSIS — R45850 Homicidal ideations: Secondary | ICD-10-CM | POA: Diagnosis not present

## 2022-09-15 DIAGNOSIS — Z5902 Unsheltered homelessness: Secondary | ICD-10-CM | POA: Insufficient documentation

## 2022-09-15 DIAGNOSIS — Z1152 Encounter for screening for COVID-19: Secondary | ICD-10-CM | POA: Diagnosis not present

## 2022-09-15 DIAGNOSIS — F22 Delusional disorders: Secondary | ICD-10-CM | POA: Insufficient documentation

## 2022-09-15 DIAGNOSIS — R45851 Suicidal ideations: Secondary | ICD-10-CM | POA: Insufficient documentation

## 2022-09-15 DIAGNOSIS — F17290 Nicotine dependence, other tobacco product, uncomplicated: Secondary | ICD-10-CM | POA: Insufficient documentation

## 2022-09-15 DIAGNOSIS — F1290 Cannabis use, unspecified, uncomplicated: Secondary | ICD-10-CM | POA: Insufficient documentation

## 2022-09-15 LAB — COMPREHENSIVE METABOLIC PANEL
ALT: 15 U/L (ref 0–44)
AST: 18 U/L (ref 15–41)
Albumin: 3.7 g/dL (ref 3.5–5.0)
Alkaline Phosphatase: 62 U/L (ref 38–126)
Anion gap: 7 (ref 5–15)
BUN: 7 mg/dL (ref 6–20)
CO2: 24 mmol/L (ref 22–32)
Calcium: 9.5 mg/dL (ref 8.9–10.3)
Chloride: 108 mmol/L (ref 98–111)
Creatinine, Ser: 0.88 mg/dL (ref 0.44–1.00)
GFR, Estimated: >60 mL/min (ref >60)
Glucose, Bld: 88 mg/dL (ref 70–99)
Potassium: 3.8 mmol/L (ref 3.5–5.1)
Sodium: 139 mmol/L (ref 135–145)
Total Bilirubin: 0.7 mg/dL (ref 0.3–1.2)
Total Protein: 7.1 g/dL (ref 6.5–8.1)

## 2022-09-15 LAB — POCT URINE DRUG SCREEN - MANUAL ENTRY (I-SCREEN)
POC Amphetamine UR: NOT DETECTED
POC Buprenorphine (BUP): NOT DETECTED
POC Cocaine UR: NOT DETECTED
POC Marijuana UR: POSITIVE — AB
POC Methadone UR: NOT DETECTED
POC Methamphetamine UR: NOT DETECTED
POC Morphine: NOT DETECTED
POC Oxazepam (BZO): NOT DETECTED
POC Oxycodone UR: NOT DETECTED
POC Secobarbital (BAR): NOT DETECTED

## 2022-09-15 LAB — CBC WITH DIFFERENTIAL/PLATELET
Abs Immature Granulocytes: 0.01 10*3/uL (ref 0.00–0.07)
Basophils Absolute: 0 10*3/uL (ref 0.0–0.1)
Basophils Relative: 0 %
Eosinophils Absolute: 0 10*3/uL (ref 0.0–0.5)
Eosinophils Relative: 0 %
HCT: 36.5 % (ref 36.0–46.0)
Hemoglobin: 12.2 g/dL (ref 12.0–15.0)
Immature Granulocytes: 0 %
Lymphocytes Relative: 47 %
Lymphs Abs: 2.5 10*3/uL (ref 0.7–4.0)
MCH: 30.3 pg (ref 26.0–34.0)
MCHC: 33.4 g/dL (ref 30.0–36.0)
MCV: 90.8 fL (ref 80.0–100.0)
Monocytes Absolute: 0.4 10*3/uL (ref 0.1–1.0)
Monocytes Relative: 7 %
Neutro Abs: 2.5 10*3/uL (ref 1.7–7.7)
Neutrophils Relative %: 46 %
Platelets: 226 10*3/uL (ref 150–400)
RBC: 4.02 MIL/uL (ref 3.87–5.11)
RDW: 13.2 % (ref 11.5–15.5)
WBC: 5.4 10*3/uL (ref 4.0–10.5)
nRBC: 0 % (ref 0.0–0.2)

## 2022-09-15 LAB — LIPID PANEL
Cholesterol: 192 mg/dL (ref 0–200)
HDL: 52 mg/dL (ref >40)
LDL Cholesterol: 128 mg/dL — ABNORMAL HIGH (ref 0–99)
Total CHOL/HDL Ratio: 3.7 RATIO
Triglycerides: 58 mg/dL (ref <150)
VLDL: 12 mg/dL (ref 0–40)

## 2022-09-15 LAB — POC SARS CORONAVIRUS 2 AG: SARSCOV2ONAVIRUS 2 AG: NEGATIVE

## 2022-09-15 LAB — TSH: TSH: 1.057 u[IU]/mL (ref 0.350–4.500)

## 2022-09-15 LAB — ETHANOL: Alcohol, Ethyl (B): <10 mg/dL (ref <10)

## 2022-09-15 LAB — RESP PANEL BY RT-PCR (FLU A&B, COVID) ARPGX2
Influenza A by PCR: NEGATIVE
Influenza B by PCR: NEGATIVE
SARS Coronavirus 2 by RT PCR: NEGATIVE

## 2022-09-15 LAB — HEMOGLOBIN A1C
Hgb A1c MFr Bld: 5.2 % (ref 4.8–5.6)
Mean Plasma Glucose: 102.54 mg/dL

## 2022-09-15 LAB — POCT PREGNANCY, URINE: Preg Test, Ur: NEGATIVE

## 2022-09-15 MED ORDER — OLANZAPINE 2.5 MG PO TABS
2.5000 mg | ORAL_TABLET | Freq: Once | ORAL | Status: AC
  Administered 2022-09-15: 2.5 mg via ORAL
  Filled 2022-09-15: qty 1

## 2022-09-15 MED ORDER — LORAZEPAM 1 MG PO TABS: 1.0000 mg | ORAL_TABLET | ORAL | Status: DC | PRN

## 2022-09-15 MED ORDER — MAGNESIUM HYDROXIDE 400 MG/5ML PO SUSP: 30.0000 mL | Freq: Every day | ORAL | Status: DC | PRN

## 2022-09-15 MED ORDER — TRAZODONE HCL 50 MG PO TABS
50.0000 mg | ORAL_TABLET | Freq: Every evening | ORAL | Status: DC | PRN
  Administered 2022-09-16: 50 mg via ORAL
  Filled 2022-09-15: qty 1

## 2022-09-15 MED ORDER — HYDROXYZINE HCL 25 MG PO TABS
25.0000 mg | ORAL_TABLET | Freq: Three times a day (TID) | ORAL | Status: DC | PRN
  Administered 2022-09-16: 25 mg via ORAL
  Filled 2022-09-15: qty 1

## 2022-09-15 MED ORDER — ACETAMINOPHEN 325 MG PO TABS: 650.0000 mg | ORAL_TABLET | Freq: Four times a day (QID) | ORAL | Status: DC | PRN

## 2022-09-15 MED ORDER — ALUM & MAG HYDROXIDE-SIMETH 200-200-20 MG/5ML PO SUSP: 30.0000 mL | ORAL | Status: DC | PRN

## 2022-09-15 MED ORDER — ZIPRASIDONE MESYLATE 20 MG IM SOLR: 20.0000 mg | INTRAMUSCULAR | Status: DC | PRN

## 2022-09-15 MED ORDER — OLANZAPINE 5 MG PO TBDP
5.0000 mg | ORAL_TABLET | Freq: Three times a day (TID) | ORAL | Status: DC | PRN
  Administered 2022-09-16 (×2): 5 mg via ORAL
  Filled 2022-09-15: qty 1
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
## 2022-09-15 NOTE — ED Notes (Signed)
Pt calm and cooperative. No c/o pain ro distress. Will continue to monitor for safety

## 2022-09-15 NOTE — ED Notes (Signed)
Pt sleeping@this time. Breathing even and unlabored. Will continue to monitor for safety 
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
## 2022-09-15 NOTE — Progress Notes (Signed)
Received Roxy in the assessment room, she was cooperative with the admission process and very pleasant. The skin assessment was completed with Sherivan MHT. She was fallowed to write down her phone numbers. She was relocated to the flex area related to the desire to be alone. She was oriented to her new environment and offered nourishments. She endorse feeling depressed, anxious and homicidal. She denied feeling  actively suicidal, but does not care if she dies. She stated hearing voices today and yesterday, but they are nice voices. She verbalized seeing shallows today and yesterday.

## 2022-09-16 ENCOUNTER — Encounter (HOSPITAL_COMMUNITY): Payer: Self-pay | Admitting: Registered Nurse

## 2022-09-16 MED ORDER — OLANZAPINE 5 MG PO TBDP
5.0000 mg | ORAL_TABLET | Freq: Every day | ORAL | Status: DC
  Administered 2022-09-17: 5 mg via ORAL
  Filled 2022-09-16 (×2): qty 1

## 2022-09-16 NOTE — ED Notes (Signed)
Pt awake and alert.  She is sitting up in bed.  Provider in to eval

## 2022-09-16 NOTE — ED Notes (Signed)
Pt continues to be soundly sleeping.  Breathing even and unlabored. In view of nursing station.  Staff will continue to monitor.
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
## 2022-09-16 NOTE — Care Management (Signed)
Care Management   Patient has been referred to the following facilities: [place]  [facility]  Writer informed the NP working with the patient.

## 2022-09-16 NOTE — Progress Notes (Signed)
Pt continues to sleep. Respirations are even and unlabored. No distress noted. Staff will monitor for pt's safety.

## 2022-09-16 NOTE — ED Notes (Signed)
Pt sleeping@this time. Breathing even and unlabored. Will continue to monitor for safety 

## 2022-09-16 NOTE — ED Notes (Signed)
Pt resting quietly watching TV without distress.

## 2022-09-16 NOTE — ED Notes (Signed)
Patient was given scheduled dose of Zyprexa 5 mg. Patient is calm and cooperative.
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
## 2022-09-16 NOTE — ED Notes (Signed)
Pt sitting quietly on the bed she is drawing.  No problems noted.

## 2022-09-16 NOTE — Progress Notes (Signed)
Pt is asleep. Respirations are even and unlabored. No distress noted. Staff will monitor for pt's safety. 

## 2022-09-16 NOTE — ED Notes (Signed)
Pt is resting quietly.  Breathing even and unlabored. She is in view of nursing station and no distress noted.   Staff will continue to monitor for safety.

## 2022-09-17 MED ORDER — OLANZAPINE 5 MG PO TBDP: 5.0000 mg | ORAL_TABLET | Freq: Every day | ORAL | 0 refills | Status: DC

## 2022-09-17 NOTE — Discharge Instructions (Addendum)

## 2022-09-17 NOTE — ED Notes (Signed)
Patient resting quietly with eyes closed no signs symptoms of distress, will continue to monitor for safety.

## 2022-09-17 NOTE — ED Notes (Signed)
Patient is resting quietly with eyes closed no S/S of distreess, respirations even and unlabored. Will continue to monitor for safety.

## 2022-09-17 NOTE — ED Notes (Signed)
Discharge instructions provided and Pt stated understanding. Pt alert, orient and ambulatory prior to d/c from facility. Personal belongings returned from locker number 17. Pt escorted to the lobby to call an Melburn Popper to go home. Safety maintained.
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
## 2022-09-17 NOTE — ED Notes (Signed)
Pt denied SI/HI/AVH. Accepted scheduled morning meds w/o difficulty. Safety maintained and will continue to monitor.

## 2022-09-17 NOTE — ED Notes (Signed)
Patient was moved out of the flex unit to the adult observation unit for accommodate a new admission.

## 2023-01-06 ENCOUNTER — Ambulatory Visit (HOSPITAL_COMMUNITY)
Admission: EM | Admit: 2023-01-06 | Discharge: 2023-01-07 | Disposition: A | Discharge status: Home / Self Care | Payer: No Payment, Other | Attending: Nurse Practitioner | Admitting: Nurse Practitioner

## 2023-01-06 DIAGNOSIS — Z1152 Encounter for screening for COVID-19: Secondary | ICD-10-CM | POA: Insufficient documentation

## 2023-01-06 DIAGNOSIS — F332 Major depressive disorder, recurrent severe without psychotic features: Secondary | ICD-10-CM | POA: Diagnosis not present

## 2023-01-06 LAB — POCT URINE DRUG SCREEN - MANUAL ENTRY (I-SCREEN)
POC Amphetamine UR: NOT DETECTED
POC Buprenorphine (BUP): NOT DETECTED
POC Cocaine UR: NOT DETECTED
POC Marijuana UR: POSITIVE — AB
POC Methadone UR: NOT DETECTED
POC Methamphetamine UR: NOT DETECTED
POC Morphine: NOT DETECTED
POC Oxazepam (BZO): NOT DETECTED
POC Oxycodone UR: NOT DETECTED
POC Secobarbital (BAR): NOT DETECTED

## 2023-01-06 LAB — POCT PREGNANCY, URINE: Preg Test, Ur: NEGATIVE

## 2023-01-06 LAB — POC URINE PREG, ED: Preg Test, Ur: NEGATIVE

## 2023-01-06 LAB — POC SARS CORONAVIRUS 2 AG: SARSCOV2ONAVIRUS 2 AG: NEGATIVE

## 2023-01-06 MED ORDER — ACETAMINOPHEN 325 MG PO TABS: 650.0000 mg | ORAL_TABLET | Freq: Four times a day (QID) | ORAL | Status: DC | PRN

## 2023-01-06 MED ORDER — TRAZODONE HCL 50 MG PO TABS
50.0000 mg | ORAL_TABLET | Freq: Every evening | ORAL | Status: DC | PRN
  Administered 2023-01-07: 50 mg via ORAL
  Filled 2023-01-06: qty 1

## 2023-01-06 MED ORDER — ALUM & MAG HYDROXIDE-SIMETH 200-200-20 MG/5ML PO SUSP: 30.0000 mL | ORAL | Status: DC | PRN

## 2023-01-06 MED ORDER — MAGNESIUM HYDROXIDE 400 MG/5ML PO SUSP: 30.0000 mL | Freq: Every day | ORAL | Status: DC | PRN

## 2023-01-06 MED ORDER — LAMOTRIGINE 25 MG PO TABS
25.0000 mg | ORAL_TABLET | Freq: Every day | ORAL | Status: DC
  Administered 2023-01-07: 25 mg via ORAL
  Filled 2023-01-06: qty 1

## 2023-01-06 MED ORDER — HYDROXYZINE HCL 25 MG PO TABS
25.0000 mg | ORAL_TABLET | Freq: Three times a day (TID) | ORAL | Status: DC | PRN
  Administered 2023-01-07: 25 mg via ORAL
  Filled 2023-01-06: qty 1

## 2023-01-06 NOTE — ED Provider Notes (Signed)
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   Protein, ur 07/08/2022 30 (A)  NEGATIVE mg/dL Final   Nitrite 07/08/2022 NEGATIVE  NEGATIVE Final   Leukocytes,Ua 07/08/2022 TRACE (A)  NEGATIVE Final   RBC / HPF 07/08/2022 0-5  0 - 5 RBC/hpf Final   WBC, UA 07/08/2022 11-20  0 - 5 WBC/hpf Final   Bacteria, UA 07/08/2022 RARE (A)  NONE SEEN Final   Squamous Epithelial / HPF 07/08/2022 21-50  0 - 5 Final   Mucus 07/08/2022 PRESENT   Final   Performed at [facility], [place] [address] [place], [place] [address]   Preg Test, Ur 07/08/2022 NEGATIVE   NEGATIVE Final   Comment:        THE SENSITIVITY OF THIS METHODOLOGY IS >20 mIU/mL. Performed at [facility], [place] [address], [place], [place] [address]     Allergies: NKDA  Medications:  Facility Ordered Medications  Medication   acetaminophen (TYLENOL) tablet 650 mg   alum &  mag hydroxide-simeth (MAALOX/MYLANTA) 200-200-20 MG/5ML suspension 30 mL   magnesium hydroxide (MILK OF MAGNESIA) suspension 30 mL   hydrOXYzine (ATARAX) tablet 25 mg   traZODone (DESYREL) tablet 50 mg   lamoTRIgine (LAMICTAL) tablet 25 mg   PTA Medications  Medication Sig   Menthol-Methyl Salicylate (MUSCLE RUB) 10-15 % CREA Apply 1 Application topically as needed for muscle pain.   Menthol, Topical Analgesic, (PAIN RELIEVING PATCH ULTRA ST EX) Apply topically.   OLANZapine zydis (ZYPREXA) 5 MG disintegrating tablet Take 1 tablet (5 mg total) by mouth daily.    Medical Decision Making  Kristine Garcia is a single 27 y/o  Black female presenting voluntarily and unaccompanied to [facility] due to worsening depression and anxiety symptoms.  Reports multiple life stressors such as inability to keep a job, financial stressors and passive suicidal ideations with no plan or intent patient.      Recommendations  Based on my evaluation the patient does not appear to have an emergency medical condition.  Patient will be admitted to [facility] continuous assessment for crisis management safety and stabilization  Jasper Riling, NP 01/07/23  7:29 AM
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
## 2023-01-07 ENCOUNTER — Other Ambulatory Visit: Payer: Self-pay

## 2023-01-07 ENCOUNTER — Inpatient Hospital Stay (HOSPITAL_COMMUNITY)
Admission: AD | Admit: 2023-01-07 | Discharge: 2023-01-16 | DRG: 885 | Disposition: A | Discharge status: Home / Self Care | Payer: No Typology Code available for payment source | Attending: Psychiatry | Admitting: Psychiatry

## 2023-01-07 ENCOUNTER — Encounter (HOSPITAL_COMMUNITY): Payer: Self-pay | Admitting: Psychiatry

## 2023-01-07 DIAGNOSIS — Z79899 Other long term (current) drug therapy: Secondary | ICD-10-CM

## 2023-01-07 DIAGNOSIS — Z634 Disappearance and death of family member: Secondary | ICD-10-CM | POA: Diagnosis not present

## 2023-01-07 DIAGNOSIS — Z560 Unemployment, unspecified: Secondary | ICD-10-CM

## 2023-01-07 DIAGNOSIS — G4700 Insomnia, unspecified: Secondary | ICD-10-CM | POA: Diagnosis present

## 2023-01-07 DIAGNOSIS — Z87891 Personal history of nicotine dependence: Secondary | ICD-10-CM

## 2023-01-07 DIAGNOSIS — Z5902 Unsheltered homelessness: Secondary | ICD-10-CM | POA: Diagnosis not present

## 2023-01-07 DIAGNOSIS — F332 Major depressive disorder, recurrent severe without psychotic features: Principal | ICD-10-CM | POA: Diagnosis present

## 2023-01-07 DIAGNOSIS — N390 Urinary tract infection, site not specified: Secondary | ICD-10-CM | POA: Diagnosis present

## 2023-01-07 DIAGNOSIS — F411 Generalized anxiety disorder: Secondary | ICD-10-CM | POA: Diagnosis present

## 2023-01-07 DIAGNOSIS — Z8616 Personal history of COVID-19: Secondary | ICD-10-CM | POA: Diagnosis not present

## 2023-01-07 DIAGNOSIS — F4310 Post-traumatic stress disorder, unspecified: Secondary | ICD-10-CM | POA: Diagnosis present

## 2023-01-07 DIAGNOSIS — R45851 Suicidal ideations: Secondary | ICD-10-CM | POA: Diagnosis present

## 2023-01-07 LAB — COMPREHENSIVE METABOLIC PANEL
ALT: 12 U/L (ref 0–44)
AST: 22 U/L (ref 15–41)
Albumin: 3 g/dL — ABNORMAL LOW (ref 3.5–5.0)
Alkaline Phosphatase: 53 U/L (ref 38–126)
Anion gap: 6 (ref 5–15)
BUN: 6 mg/dL (ref 6–20)
CO2: 26 mmol/L (ref 22–32)
Calcium: 8.9 mg/dL (ref 8.9–10.3)
Chloride: 106 mmol/L (ref 98–111)
Creatinine, Ser: 0.88 mg/dL (ref 0.44–1.00)
GFR, Estimated: >60 mL/min (ref >60)
Glucose, Bld: 123 mg/dL — ABNORMAL HIGH (ref 70–99)
Potassium: 3.2 mmol/L — ABNORMAL LOW (ref 3.5–5.1)
Sodium: 138 mmol/L (ref 135–145)
Total Bilirubin: 0.5 mg/dL (ref 0.3–1.2)
Total Protein: 6 g/dL — ABNORMAL LOW (ref 6.5–8.1)

## 2023-01-07 LAB — CBC WITH DIFFERENTIAL/PLATELET
Abs Immature Granulocytes: 0.04 10*3/uL (ref 0.00–0.07)
Basophils Absolute: 0 10*3/uL (ref 0.0–0.1)
Basophils Relative: 0 %
Eosinophils Absolute: 0.1 10*3/uL (ref 0.0–0.5)
Eosinophils Relative: 1 %
HCT: 38.8 % (ref 36.0–46.0)
Hemoglobin: 12.8 g/dL (ref 12.0–15.0)
Immature Granulocytes: 1 %
Lymphocytes Relative: 37 %
Lymphs Abs: 3 10*3/uL (ref 0.7–4.0)
MCH: 30.4 pg (ref 26.0–34.0)
MCHC: 33 g/dL (ref 30.0–36.0)
MCV: 92.2 fL (ref 80.0–100.0)
Monocytes Absolute: 0.4 10*3/uL (ref 0.1–1.0)
Monocytes Relative: 5 %
Neutro Abs: 4.7 10*3/uL (ref 1.7–7.7)
Neutrophils Relative %: 56 %
Platelets: 227 10*3/uL (ref 150–400)
RBC: 4.21 MIL/uL (ref 3.87–5.11)
RDW: 14.4 % (ref 11.5–15.5)
WBC: 8.2 10*3/uL (ref 4.0–10.5)
nRBC: 0 % (ref 0.0–0.2)

## 2023-01-07 LAB — RESP PANEL BY RT-PCR (RSV, FLU A&B, COVID)  RVPGX2
Influenza A by PCR: NEGATIVE
Influenza B by PCR: NEGATIVE
Resp Syncytial Virus by PCR: NEGATIVE
SARS Coronavirus 2 by RT PCR: NEGATIVE

## 2023-01-07 LAB — ETHANOL: Alcohol, Ethyl (B): <10 mg/dL (ref <10)

## 2023-01-07 MED ORDER — ACETAMINOPHEN 325 MG PO TABS
650.0000 mg | ORAL_TABLET | Freq: Four times a day (QID) | ORAL | Status: DC | PRN
  Administered 2023-01-07 – 2023-01-08 (×2): 650 mg via ORAL
  Filled 2023-01-07 (×2): qty 2

## 2023-01-07 MED ORDER — POTASSIUM CHLORIDE CRYS ER 20 MEQ PO TBCR: 20.0000 meq | EXTENDED_RELEASE_TABLET | Freq: Two times a day (BID) | ORAL | Status: DC

## 2023-01-07 MED ORDER — OLANZAPINE 5 MG PO TABS: 5.0000 mg | ORAL_TABLET | Freq: Every day | ORAL | Status: DC

## 2023-01-07 MED ORDER — HYDROXYZINE HCL 25 MG PO TABS
25.0000 mg | ORAL_TABLET | Freq: Three times a day (TID) | ORAL | Status: DC | PRN
  Administered 2023-01-08 – 2023-01-15 (×6): 25 mg via ORAL
  Filled 2023-01-07 (×6): qty 1

## 2023-01-07 MED ORDER — POTASSIUM CHLORIDE CRYS ER 20 MEQ PO TBCR
20.0000 meq | EXTENDED_RELEASE_TABLET | Freq: Once | ORAL | Status: AC
  Administered 2023-01-07: 20 meq via ORAL
  Filled 2023-01-07 (×2): qty 1

## 2023-01-07 MED ORDER — ALUM & MAG HYDROXIDE-SIMETH 200-200-20 MG/5ML PO SUSP: 30.0000 mL | ORAL | Status: DC | PRN

## 2023-01-07 MED ORDER — TRAZODONE HCL 50 MG PO TABS
50.0000 mg | ORAL_TABLET | Freq: Every evening | ORAL | Status: DC | PRN
  Administered 2023-01-08 – 2023-01-15 (×8): 50 mg via ORAL
  Filled 2023-01-07 (×8): qty 1

## 2023-01-07 MED ORDER — POTASSIUM CHLORIDE CRYS ER 20 MEQ PO TBCR
20.0000 meq | EXTENDED_RELEASE_TABLET | Freq: Two times a day (BID) | ORAL | Status: DC
  Administered 2023-01-07: 20 meq via ORAL
  Filled 2023-01-07: qty 1

## 2023-01-07 MED ORDER — OLANZAPINE 5 MG PO TABS
5.0000 mg | ORAL_TABLET | Freq: Every day | ORAL | Status: DC
  Administered 2023-01-07 – 2023-01-12 (×6): 5 mg via ORAL
  Filled 2023-01-07 (×9): qty 1

## 2023-01-07 MED ORDER — MAGNESIUM HYDROXIDE 400 MG/5ML PO SUSP
30.0000 mL | Freq: Every day | ORAL | Status: DC | PRN
  Administered 2023-01-16: 30 mL via ORAL
  Filled 2023-01-07: qty 30

## 2023-01-07 NOTE — Plan of Care (Signed)
  Problem: Education: Goal: Emotional status will improve Outcome: Not Progressing Goal: Mental status will improve Outcome: Not Progressing   Problem: Coping: Goal: Coping ability will improve Outcome: Not Progressing   Problem: Coping: Goal: Coping ability will improve Outcome: Not Progressing Goal: Will verbalize feelings Outcome: Not Progressing

## 2023-01-07 NOTE — ED Notes (Signed)
Pt sleeping in recliner bed. RR even and unlabored. Will continue to monitor for safety 
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
## 2023-01-07 NOTE — BHH Group Notes (Signed)
Adult Psychoeducational Group  Date:  01/07/2023 Time: 1300-1400  Group Topic/Focus: Continuation of the group from Saturday. Looking at the lists that were created and talking about what needs to be done with the homework of 30 positives about themselves.                                     Talking about taking their power back and helping themselves to develop a positive self esteem.      Participation Quality:  Pt did not attend the group  Bryson Dames A
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
## 2023-01-07 NOTE — Discharge Instructions (Signed)
Transfer to [facility] for IP admission, Dr. Winfred Leeds is accepting provider

## 2023-01-07 NOTE — ED Notes (Signed)
Patient A&O x 4, ambulatory. Patient discharged in no acute distress. Patient denied SI/HI, endorses A/VH upon discharge.  Pt belongings returned to patient from locker #6  intact. Patient escorted to lobby via staff for transport to [facility] via safe transport. Safety maintained.

## 2023-01-07 NOTE — Progress Notes (Signed)
Patient denies SI/HI/VH. Endorses AH. "They tell me to love myself." Patient observed restless in the hallway and appearing to be responding to internal stimuli. Medications administered without incident. No adverse reactions observed. Safety checks in place. Patient remains safe at this time.  01/07/23 2035  Psych Admission Type (Psych Patients Only)  Admission Status Voluntary  Psychosocial Assessment  Patient Complaints Anxiety  Eye Contact Brief  Facial Expression Blank  Affect Anxious  Speech Logical/coherent  Interaction Assertive  Motor Activity Other (Comment) (WDL)  Appearance/Hygiene Unremarkable  Behavior Characteristics Cooperative;Anxious  Mood Anxious  Thought Process  Coherency WDL  Content WDL  Delusions None reported or observed  Perception Hallucinations  Hallucination Auditory  Judgment Poor  Confusion None  Danger to Self  Current suicidal ideation? Denies  Agreement Not to Harm Self Yes  Description of Agreement Verbal  Danger to Others  Danger to Others None reported or observed

## 2023-01-07 NOTE — ED Notes (Signed)
Pt up to bathroom and returned to recliner bed. No noted distress. Will continue to monitor for safety

## 2023-01-07 NOTE — Tx Team (Signed)
Initial Treatment Plan 01/07/2023 8:44 PM Kristine Garcia KZL:[id number]    PATIENT STRESSORS: Financial difficulties   Loss of mother at age 27   Other: homelessness     PATIENT STRENGTHS: Ability for insight  Communication skills  Motivation for treatment/growth  Physical Health    PATIENT IDENTIFIED PROBLEMS: Suicidal ideation  "Sometimes I wish I would go to sleep and not wake up"    "I need help self regulating- not getting triggered"    Hopelessness           DISCHARGE CRITERIA:  Improved stabilization in mood, thinking, and/or behavior Reduction of life-threatening or endangering symptoms to within safe limits Verbal commitment to aftercare and medication compliance  PRELIMINARY DISCHARGE PLAN: Attend aftercare/continuing care group Outpatient therapy Placement in alternative living arrangements  PATIENT/FAMILY INVOLVEMENT: This treatment plan has been presented to and reviewed with the patient, Kristine Garcia,  The patient has been given the opportunity to ask questions and make suggestions.  Waymond Cera, RN 01/07/2023, 8:44 PM
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
## 2023-01-07 NOTE — ED Notes (Signed)
Pt was in bathroom extended period of time. She was found sitting clothed on toilet, tearful.  She states " I am over-thinking things". Encouraged her to return to recliner bed and try to sleep. Pt had meds for anxiety and sleep. She denies SI/HI/AVH. She is depressed, tearful, flat affect. She CFS @ present time. Minimal eye contact. Will continue to monitor for safety

## 2023-01-07 NOTE — BHH Group Notes (Signed)
Adult Psychoeducational Group Note Date:  01/07/2023 Time:  0900-1000 Group Topic/Focus: PROGRESSIVE RELAXATION. Garcia group where deep breathing is taught and tensing and relaxation muscle groups is used. Imagery is used as well.  Pts are asked to imagine 3 pillars that hold them up when they are not able to hold themselves up and to share that with the group.   Participation Level:  did not attend   : Kristine Garcia   

## 2023-01-07 NOTE — ED Notes (Signed)
Patient  sleeping in no acute stress. RR even and unlabored .Environment secured .Will continue to monitor for safely. 

## 2023-01-07 NOTE — ED Notes (Signed)
Called safe transport @ 1137 am Gave report to [place] @ [phone]

## 2023-01-07 NOTE — ED Notes (Signed)
Rn offered patient tylenol for her back pain . Patient refused states that she will take some later. Will continue to monitor.Patient alert and oriented x 3. Denies SI/HI endorses AVH. Denies intent or plan to harm self or others. Routine conducted according to faculty protocol. Encourage patient to notify staff with any needs or concerns. Patient verbalized agreement and understanding. Will continue to monitor for safety.

## 2023-01-08 ENCOUNTER — Encounter (HOSPITAL_COMMUNITY): Payer: Self-pay

## 2023-01-08 DIAGNOSIS — F332 Major depressive disorder, recurrent severe without psychotic features: Secondary | ICD-10-CM

## 2023-01-08 LAB — BASIC METABOLIC PANEL
Anion gap: 9 (ref 5–15)
BUN: 10 mg/dL (ref 6–20)
CO2: 26 mmol/L (ref 22–32)
Calcium: 9.2 mg/dL (ref 8.9–10.3)
Chloride: 104 mmol/L (ref 98–111)
Creatinine, Ser: 0.9 mg/dL (ref 0.44–1.00)
GFR, Estimated: >60 mL/min (ref >60)
Glucose, Bld: 90 mg/dL (ref 70–99)
Potassium: 3.7 mmol/L (ref 3.5–5.1)
Sodium: 139 mmol/L (ref 135–145)

## 2023-01-08 LAB — COMPREHENSIVE METABOLIC PANEL
ALT: 13 U/L (ref 0–44)
AST: 19 U/L (ref 15–41)
Albumin: 3.4 g/dL — ABNORMAL LOW (ref 3.5–5.0)
Alkaline Phosphatase: 51 U/L (ref 38–126)
Anion gap: 10 (ref 5–15)
BUN: 10 mg/dL (ref 6–20)
CO2: 26 mmol/L (ref 22–32)
Calcium: 9.1 mg/dL (ref 8.9–10.3)
Chloride: 104 mmol/L (ref 98–111)
Creatinine, Ser: 0.93 mg/dL (ref 0.44–1.00)
GFR, Estimated: >60 mL/min (ref >60)
Glucose, Bld: 87 mg/dL (ref 70–99)
Potassium: 3.8 mmol/L (ref 3.5–5.1)
Sodium: 140 mmol/L (ref 135–145)
Total Bilirubin: 0.7 mg/dL (ref 0.3–1.2)
Total Protein: 6.5 g/dL (ref 6.5–8.1)

## 2023-01-08 MED ORDER — DIPHENHYDRAMINE HCL 25 MG PO CAPS: 50.0000 mg | ORAL_CAPSULE | Freq: Three times a day (TID) | ORAL | Status: DC | PRN

## 2023-01-08 MED ORDER — HALOPERIDOL LACTATE 5 MG/ML IJ SOLN: 5.0000 mg | Freq: Three times a day (TID) | INTRAMUSCULAR | Status: DC | PRN

## 2023-01-08 MED ORDER — DIPHENHYDRAMINE HCL 50 MG/ML IJ SOLN: 50.0000 mg | Freq: Three times a day (TID) | INTRAMUSCULAR | Status: DC | PRN

## 2023-01-08 MED ORDER — FLUOXETINE HCL 20 MG PO CAPS
20.0000 mg | ORAL_CAPSULE | Freq: Every day | ORAL | Status: DC
  Administered 2023-01-09 – 2023-01-11 (×3): 20 mg via ORAL
  Filled 2023-01-08 (×5): qty 1

## 2023-01-08 MED ORDER — HALOPERIDOL 5 MG PO TABS: 5.0000 mg | ORAL_TABLET | Freq: Three times a day (TID) | ORAL | Status: DC | PRN

## 2023-01-08 MED ORDER — LORAZEPAM 2 MG/ML IJ SOLN: 2.0000 mg | Freq: Three times a day (TID) | INTRAMUSCULAR | Status: DC | PRN

## 2023-01-08 MED ORDER — LORAZEPAM 1 MG PO TABS: 2.0000 mg | ORAL_TABLET | Freq: Three times a day (TID) | ORAL | Status: DC | PRN

## 2023-01-08 MED ORDER — BENZOCAINE 10 % MT GEL: Freq: Four times a day (QID) | OROMUCOSAL | Status: DC | PRN

## 2023-01-08 NOTE — Progress Notes (Signed)
   01/08/23 2230  Psych Admission Type (Psych Patients Only)  Admission Status Voluntary  Psychosocial Assessment  Patient Complaints Anxiety  Eye Contact Fair  Facial Expression Anxious  Affect Anxious;Preoccupied  Speech Logical/coherent  Interaction Assertive  Motor Activity Slow  Appearance/Hygiene Unremarkable  Behavior Characteristics Cooperative  Mood Pleasant  Aggressive Behavior  Effect No apparent injury  Thought Process  Coherency Circumstantial  Content Blaming others  Delusions None reported or observed  Perception Hallucinations  Hallucination Auditory  Judgment Limited  Confusion WDL  Danger to Self  Current suicidal ideation? Denies

## 2023-01-08 NOTE — Progress Notes (Signed)
   01/08/23 0530  15 Minute Checks  Location Bedroom  Visual Appearance Calm  Behavior Sleeping  Sleep (Behavioral Health Patients Only)  Calculate sleep? (Click Yes once per 24 hr at 0600 safety check) Yes  Documented sleep last 24 hours 6.5

## 2023-01-08 NOTE — H&P (Cosign Needed Addendum)
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 psychosis (HCC) Long Term Goal(s): Improvement in symptoms so as ready for discharge  Short Term Goals: Ability to identify changes in lifestyle to reduce recurrence of condition will improve, Ability to verbalize feelings will improve, Ability to disclose and discuss suicidal ideas, Ability to demonstrate self-control will improve, Ability to identify and develop  effective coping behaviors will improve, Ability to maintain clinical measurements within normal limits will improve, Compliance with prescribed medications will improve, and Ability to identify triggers associated with substance abuse/mental health issues will improve  Physician Treatment Plan for Secondary Diagnosis: Principal Problem:   MDD (major depressive disorder), recurrent severe, without psychosis (HCC)  Treatment Plan Summary: Daily contact with patient to assess and evaluate symptoms and progress in treatment and Medication management  Plan: --Initiate Zyprexa tablet 5 mg p.o. daily at bedtime for psychosis --Initiate Prozac 20 mg p.o. daily for depression starting tomorrow 01/09/2023  Anxiety -Initiate hydroxyzine 25 mg 3 times daily as needed/anxiety   Insomnia -Initiate trazodone 50 mg 1 p.o. daily at bedtime for insomnia as needed  Agitation protocol: Haldol 5 mg tablets p.o. or IM 3 times daily as needed agitation and Lorazepam Ativan tablet 2 mg p.o. IM 3 times daily as needed agitation and Benadryl 50 mg p.o. or IM 3 times daily as needed for agitation  -- The risks/benefits/side-effects/alternatives to this medication were discussed in detail with the patient and time was given for questions. The patient consents to medication trial.              -- Encouraged patient to participate in unit milieu and in scheduled group therapies   Other PRN Medications -Acetaminophen 650 mg every 6 as needed/mild pain -Maalox 30 mL oral every 4 hours as needed/digestion -Magnesium hydroxide 30 mL daily as needed/mild constipation  Safety and Monitoring: Voluntary admission to inpatient psychiatric unit for safety, stabilization and treatment Daily contact with patient to assess and evaluate symptoms and progress in treatment Patient's case to be discussed in multi-disciplinary team meeting Observation Level : q15 minute checks Vital signs: q12 hours Precautions: suicide, but pt  currently verbally contracts for safety   Discharge Planning: Social work and case management to assist with discharge planning and identification of hospital follow-up needs prior to discharge Estimated LOS: 5-7 days Discharge Concerns: Need to establish a safety plan; Medication compliance and effectiveness Discharge Goals: Return home with outpatient referrals for mental health follow-up including medication management/psychotherapy.  I certify that inpatient services furnished can reasonably be expected to improve the patient's condition.    03/10/2023, FNP 2/5/20244:02 PM
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
## 2023-01-08 NOTE — BHH Suicide Risk Assessment (Signed)
[place] INPATIENT:  Family/Significant Other Suicide Prevention Education  Suicide Prevention Education:  Education Completed; 01-08-2023, Milinda Hirschfeld [phone] Rozetta Nunnery been identified by the patient as the family member with whom the patient will be residing, and identified as the person(s) who will aid the patient in the event of a mental health crisis (suicidal ideations/suicide attempt).  With written consent from the patient, the family member/significant other has been provided the following suicide prevention education, prior to the and/or following the discharge of the patient.  The suicide prevention education provided includes the following: Suicide risk factors Suicide prevention and interventions National Suicide Hotline telephone number [facility] assessment telephone number [facility] Assistance [facility] [place] and/or Residential Mobile Crisis Unit telephone number  Request made of family/significant other to: Remove weapons (e.g., guns, rifles, knives), all items previously/currently identified as safety concern.   Remove drugs/medications (over-the-counter, prescriptions, illicit drugs), all items previously/currently identified as a safety concern.  Milinda Hirschfeld [phone] (Friend) verbalizes understanding of the suicide prevention education information provided.  The family member/significant other agrees to remove the items of safety concern listed above.  Layci Stenglein S Torey Regan 01/08/2023, 11:37 AM

## 2023-01-08 NOTE — Group Note (Signed)
Recreation Therapy Group Note   Group Topic:Problem Solving  Group Date: 01/08/2023 Start Time: 0935 End Time: 1000 Facilitators: Nikiya Starn-McCall, LRT,CTRS Location: [address] Dayroom   Goal Area(s) Addresses:  Patient will effectively work with peer towards shared goal.  Patient will identify skills used to make activity successful.  Patient will identify how skills used during activity can be used to reach post d/c goals.   Group Description: Landing Pad. In teams of 3-5, patients were given 12 plastic drinking straws and an equal length of masking tape. Using the materials provided, patients were asked to build a landing pad to catch a golf ball dropped from approximately 5 feet in the air. All materials were required to be used by the team in their design. LRT facilitated post-activity discussion.   Affect/Mood: N/A   Participation Level: Did not attend    Clinical Observations/Individualized Feedback:     Plan: Continue to engage patient in RT group sessions 2-3x/week.   Kristine Garcia, LRT,CTRS 01/08/2023 12:32 PM

## 2023-01-08 NOTE — BHH Group Notes (Signed)
The focus of this group is to help patients review their daily goal of treatment and discuss progress on daily workbooks. Pt was attentive and appropriate during tonight's wrap up group. The focus of this group is to help patients review their daily goal of treatment and discuss progress on daily workbooks.

## 2023-01-08 NOTE — Group Note (Signed)
Occupational Therapy Group Note  Group Topic:Coping Skills  Group Date: 01/08/2023 Start Time: 1419 End Time: 1521 Facilitators: Brantley Stage, OT   Group Description: Group encouraged increased engagement and participation through discussion and activity focused on "Coping Ahead." Patients were split up into teams and selected a card from a stack of positive coping strategies. Patients were instructed to act out/charade the coping skill for other peers to guess and receive points for their team. Discussion followed with a focus on identifying additional positive coping strategies and patients shared how they were going to cope ahead over the weekend while continuing hospitalization stay.  Therapeutic Goal(s): Identify positive vs negative coping strategies. Identify coping skills to be used during hospitalization vs coping skills outside of hospital/at home Increase participation in therapeutic group environment and promote engagement in treatment   Participation Level: Engaged   Participation Quality: Independent   Behavior: Appropriate   Speech/Thought Process: Tangential    Affect/Mood: Incongruent   Insight: Limited   Judgement: Limited   Individualization: pt was engaged in their participation of group discussion/activity. New skills were identified  Modes of Intervention: Discussion and Education  Patient Response to Interventions:  Engaged   Plan: Continue to engage patient in OT groups 2 - 3x/week.  01/08/2023  Brantley Stage, OT Cornell Barman, OT

## 2023-01-08 NOTE — BH IP Treatment Plan (Signed)
Interdisciplinary Treatment and Diagnostic Plan Update  01/08/2023 Time of Session: 10:05 AM  Kristine Garcia MRN: [id number]  Principal Diagnosis: MDD (major depressive disorder), recurrent severe, without psychosis ([place])  Secondary Diagnoses: Principal Problem:   MDD (major depressive disorder), recurrent severe, without psychosis ([place])   Current Medications:  Current Facility-Administered Medications  Medication Dose Route Frequency Provider Last Rate Last Admin   acetaminophen (TYLENOL) tablet 650 mg  650 mg Oral Q6H PRN Revonda Humphrey, NP   650 mg at 01/07/23 2035   alum & mag hydroxide-simeth (MAALOX/MYLANTA) 200-200-20 MG/5ML suspension 30 mL  30 mL Oral Q4H PRN Revonda Humphrey, NP       benzocaine (ORAJEL) 10 % mucosal gel   Mouth/Throat QID PRN Massengill, Ovid Curd, MD       haloperidol (HALDOL) tablet 5 mg  5 mg Oral TID PRN Janine Limbo, MD       And   LORazepam (ATIVAN) tablet 2 mg  2 mg Oral TID PRN Janine Limbo, MD       And   diphenhydrAMINE (BENADRYL) capsule 50 mg  50 mg Oral TID PRN Massengill, Ovid Curd, MD       haloperidol lactate (HALDOL) injection 5 mg  5 mg Intramuscular TID PRN Massengill, Ovid Curd, MD       And   LORazepam (ATIVAN) injection 2 mg  2 mg Intramuscular TID PRN Massengill, Ovid Curd, MD       And   diphenhydrAMINE (BENADRYL) injection 50 mg  50 mg Intramuscular TID PRN Massengill, Ovid Curd, MD       hydrOXYzine (ATARAX) tablet 25 mg  25 mg Oral TID PRN Revonda Humphrey, NP       magnesium hydroxide (MILK OF MAGNESIA) suspension 30 mL  30 mL Oral Daily PRN Revonda Humphrey, NP       OLANZapine (ZYPREXA) tablet 5 mg  5 mg Oral QHS Revonda Humphrey, NP   5 mg at 01/07/23 2035   traZODone (DESYREL) tablet 50 mg  50 mg Oral QHS PRN Revonda Humphrey, NP       PTA Medications: Medications Prior to Admission  Medication Sig Dispense Refill Last Dose   gabapentin (NEURONTIN) 300 MG capsule Take 300 mg by mouth daily as needed (pain).       Ketoprofen (FROTEK) 10 % CREA Apply 1 Application topically 3 (three) times daily as needed (pain).       Patient Stressors: Financial difficulties   Loss of mother at age 10   Other: homelessness    Patient Strengths: Ability for insight  Hydrographic surveyor for treatment/growth  Physical Health   Treatment Modalities: Medication Management, Group therapy, Case management,  1 to 1 session with clinician, Psychoeducation, Recreational therapy.   Physician Treatment Plan for Primary Diagnosis: MDD (major depressive disorder), recurrent severe, without psychosis ([place]) Long Term Goal(s):     Short Term Goals:    Medication Management: Evaluate patient's response, side effects, and tolerance of medication regimen.  Therapeutic Interventions: 1 to 1 sessions, Unit Group sessions and Medication administration.  Evaluation of Outcomes: Not Progressing  Physician Treatment Plan for Secondary Diagnosis: Principal Problem:   MDD (major depressive disorder), recurrent severe, without psychosis ([place])  Long Term Goal(s):     Short Term Goals:       Medication Management: Evaluate patient's response, side effects, and tolerance of medication regimen.  Therapeutic Interventions: 1 to 1 sessions, Unit Group sessions and Medication administration.  Evaluation of Outcomes: Not Progressing  RN Treatment Plan for Primary Diagnosis: MDD (major depressive disorder), recurrent severe, without psychosis ([place]) Long Term Goal(s): Knowledge of disease and therapeutic regimen to maintain health will improve  Short Term Goals: Ability to remain free from injury will improve, Ability to verbalize frustration and anger appropriately will improve, Ability to demonstrate self-control, Ability to participate in decision making will improve, Ability to verbalize feelings will improve, Ability to disclose and discuss suicidal ideas, Ability to identify and develop effective coping behaviors  will improve, and Compliance with prescribed medications will improve  Medication Management: RN will administer medications as ordered by provider, will assess and evaluate patient's response and provide education to patient for prescribed medication. RN will report any adverse and/or side effects to prescribing provider.  Therapeutic Interventions: 1 on 1 counseling sessions, Psychoeducation, Medication administration, Evaluate responses to treatment, Monitor vital signs and CBGs as ordered, Perform/monitor CIWA, COWS, AIMS and Fall Risk screenings as ordered, Perform wound care treatments as ordered.  Evaluation of Outcomes: Not Progressing   LCSW Treatment Plan for Primary Diagnosis: MDD (major depressive disorder), recurrent severe, without psychosis ([place]) Long Term Goal(s): Safe transition to appropriate next level of care at discharge, Engage patient in therapeutic group addressing interpersonal concerns.  Short Term Goals: Engage patient in aftercare planning with referrals and resources, Increase social support, Increase ability to appropriately verbalize feelings, Increase emotional regulation, Facilitate acceptance of mental health diagnosis and concerns, Facilitate patient progression through stages of change regarding substance use diagnoses and concerns, Identify triggers associated with mental health/substance abuse issues, and Increase skills for wellness and recovery  Therapeutic Interventions: Assess for all discharge needs, 1 to 1 time with Social worker, Explore available resources and support systems, Assess for adequacy in community support network, Educate family and significant other(s) on suicide prevention, Complete Psychosocial Assessment, Interpersonal group therapy.  Evaluation of Outcomes: Not Progressing   Progress in Treatment: Attending groups: Yes. Participating in groups: Yes. Taking medication as prescribed: Yes. Toleration medication:  Yes. Family/Significant other contact made: Yes, individual(s) contacted:  Milinda Hirschfeld [phone] (Friend) Patient understands diagnosis: Yes. Discussing patient identified problems/goals with staff: Yes. Medical problems stabilized or resolved: Yes. Denies suicidal/homicidal ideation: Yes. Issues/concerns per patient self-inventory: No.   New problem(s) identified: No, Describe:  None Reported   New Short Term/Long Term Goal(s): medication stabilization, elimination of SI thoughts, development of comprehensive mental wellness plan.    Patient Goals:  " Get focus, see what is wrong with me, help cope with my problems, and I need resources for housing"   Discharge Plan or Barriers: Patient recently admitted. CSW will continue to follow and assess for appropriate referrals and possible discharge planning.    Reason for Continuation of Hospitalization: Anxiety Depression Medication stabilization Suicidal ideation  Estimated Length of Stay: 3-5 days   Last 3 [place] Suicide Severity Risk Score: [place] Admission (Current) from 01/07/2023 in [facility] ED from [facility]/06/20[facility] ED from 09/15/2022 in [place] Low Risk No Risk No Risk       Last [facility] 2/9 Scores:    01/06/2023   11:13 PM  Depression screen PHQ 2/9  Decreased Interest 1  Down, Depressed, Hopeless 1  PHQ - 2 Score 2  Altered sleeping 1  Tired, decreased energy 1  Change in appetite 1  Feeling bad or failure about yourself  1  Trouble concentrating 1  Moving slowly or fidgety/restless 1  Suicidal thoughts 1  PHQ-9 Score 9  Difficult doing work/chores Somewhat difficult    Scribe for Treatment Team: Charlett Lango 01/08/2023 12:36 PM
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
## 2023-01-08 NOTE — Progress Notes (Signed)
   01/08/23 1300  Psych Admission Type (Psych Patients Only)  Admission Status Voluntary  Psychosocial Assessment  Patient Complaints None  Eye Contact Fair  Facial Expression Blank  Affect Anxious  Speech Logical/coherent  Interaction Assertive  Motor Activity Other (Comment) (WDL)  Appearance/Hygiene Unremarkable  Behavior Characteristics Cooperative  Mood Pleasant  Thought Process  Coherency WDL  Content WDL  Delusions None reported or observed  Perception Hallucinations (AH reported by patient that "tell her good things")  Hallucination Auditory (pt reports that AH are not distressing)  Judgment Limited  Confusion None  Danger to Self  Current suicidal ideation? Denies  Agreement Not to Harm Self Yes  Description of Agreement verbal  Danger to Others  Danger to Others None reported or observed

## 2023-01-09 DIAGNOSIS — F332 Major depressive disorder, recurrent severe without psychotic features: Secondary | ICD-10-CM | POA: Diagnosis not present

## 2023-01-09 DIAGNOSIS — F411 Generalized anxiety disorder: Secondary | ICD-10-CM | POA: Insufficient documentation

## 2023-01-09 DIAGNOSIS — F4310 Post-traumatic stress disorder, unspecified: Secondary | ICD-10-CM | POA: Insufficient documentation

## 2023-01-09 NOTE — Progress Notes (Signed)
   01/09/23 0556  15 Minute Checks  Location Bedroom  Visual Appearance Calm  Behavior Sleeping  Sleep (Behavioral Health Patients Only)  Calculate sleep? (Click Yes once per 24 hr at 0600 safety check) Yes  Documented sleep last 24 hours 7.75

## 2023-01-09 NOTE — Progress Notes (Signed)
   01/09/23 1000  Psych Admission Type (Psych Patients Only)  Admission Status Voluntary  Psychosocial Assessment  Patient Complaints Anxiety;Agitation  Eye Contact Fair  Facial Expression Anxious (angry at times)  Affect Labile  Speech Logical/coherent (noted to raise voice at RN at times but then apologizes)  Interaction Assertive;Defensive  Motor Activity Slow  Appearance/Hygiene Unremarkable  Behavior Characteristics Irritable;Cooperative  Mood Labile  Thought Process  Coherency WDL  Content Blaming others;Preoccupation  Delusions None reported or observed  Perception Hallucinations (pt denies this morning but RIS)  Hallucination Auditory  Judgment Limited  Confusion None  Danger to Self  Current suicidal ideation? Denies  Agreement Not to Harm Self Yes  Description of Agreement verbal  Danger to Others  Danger to Others None reported or observed

## 2023-01-09 NOTE — Progress Notes (Signed)
   01/09/23 2015  Psych Admission Type (Psych Patients Only)  Admission Status Voluntary  Psychosocial Assessment  Patient Complaints Anxiety  Eye Contact Fair  Facial Expression Anxious  Affect Anxious;Preoccupied  Speech Logical/coherent  Interaction Assertive  Motor Activity Slow  Appearance/Hygiene Unremarkable  Behavior Characteristics Cooperative  Mood Labile  Aggressive Behavior  Effect No apparent injury  Thought Process  Coherency Circumstantial  Content Blaming others  Delusions None reported or observed  Perception Hallucinations  Hallucination Auditory  Judgment Limited  Confusion WDL  Danger to Self  Current suicidal ideation? Denies

## 2023-01-09 NOTE — Progress Notes (Signed)
Adult Psychoeducational Group Note  Date:  01/09/2023 Time:  9:45 PM  Group Topic/Focus:  Wrap-Up Group:   The focus of this group is to help patients review their daily goal of treatment and discuss progress on daily workbooks.  Participation Level:  Active  Participation Quality:  Appropriate  Affect:  Appropriate  Cognitive:  Appropriate  Insight: Appropriate  Engagement in Group:  Engaged  Modes of Intervention:  Discussion  Additional Comments:  Pt did not have a goal for the day.  Tonia Brooms D 01/09/2023, 9:45 PM

## 2023-01-09 NOTE — Group Note (Signed)
Recreation Therapy Group Note   Group Topic:Animal Assisted Therapy   Group Date: 01/09/2023 Start Time: 1430 End Time: 1505 Facilitators: Roslyn Else-McCall, LRT,CTRS Location: [address] Dayroom   Animal-Assisted Activity (AAA) Program Checklist/Progress Notes Patient Eligibility Criteria Checklist & Daily Group note for Rec Tx Intervention  AAA/T Program Assumption of Risk Form signed by Patient/ or Parent Legal Guardian Yes  Patient understands his/her participation is voluntary Yes   Affect/Mood: N/A   Participation Level: Did not attend    Clinical Observations/Individualized Feedback:     Plan: Continue to engage patient in RT group sessions 2-3x/week.   Keitha Kolk-McCall, LRT,CTRS 01/09/2023 3:47 PM

## 2023-01-09 NOTE — Progress Notes (Signed)
[facility] MD Progress Note  01/09/2023 2:35 PM Kristine Garcia  MRN:  [id number]  Subjective:    Kristine Garcia is a single 27 y/o African-American female with past psychiatric history of major depressive disorder, recurrent without psychosis, anxiety disorder, and medical history of vitamin D deficiency who presents voluntarily to [facility] from [facility] due to worsening depression and anxiety symptoms.      Yesterday the psychiatry team made the following recommendations: --Initiate Zyprexa tablet 5 mg p.o. daily at bedtime for psychosis --Initiate Prozac 20 mg p.o. daily for depression starting tomorrow 01/09/2023  On my assessment today, I reviewed the H&P with the patient, we discussed her psychiatric history, symptoms leading up to the patient's hospitalization, and treatment plan.  The patient reports feeling down and depressed for some time.  Reports psychotic symptoms including AH and VH since childhood but became more noticeable interfering with her ability to concentrate around the time she had COVID.  Reports paranoia as well-unclear if this is psychotic or due to severe anxiety and traumatic life experiences. She reports that her mood is down depressed and sad.  Reports better sleep with Zyprexa.  Reports appetite is okay.  Concentration is poor.  Patient reports having suicidal thoughts, passive without any intent or plan.  Denies any HI.  Reports anxiety is elevated and excessive. She denies any side effects to starting Zyprexa.  We discussed the addition of Prozac for treating depression.   Principal Problem: MDD (major depressive disorder), recurrent severe, without psychosis ([place]) Diagnosis: Principal Problem:   MDD (major depressive disorder), recurrent severe, without psychosis ([place])  Total Time spent with patient: 15 minutes  Past Psychiatric History:  Previous Psych Diagnoses: Yes, major depressive disorder Prior inpatient treatment: Patient  denies Current/prior outpatient treatment: Patient denies Prior rehab hx: Denies Psychotherapy hx: Patient denies History of suicide: Yes, patient attempted to hang self with a rope in Apr 05, 2003, when her mother died.  Then in 04-04-2005 she attempted to overdose with a bunch of medication, nonspecific any history of blood History of homicide or aggression: Denies Psychiatric medication history: Patient denies Psychiatric medication compliance history: Patient has not started any psychotropic drugs Neuromodulation history: Denies Current Psychiatrist: Patient denies Current therapist: Denies  Past Medical History:  Past Medical History:  Diagnosis Date   Known health problems: none    Vitamin D deficiency     Past Surgical History:  Procedure Laterality Date   NO PAST SURGERIES     Family History:  Family History  Problem Relation Age of Onset   Hypertension Mother    Hypertension Father    Sickle cell trait Maternal Grandmother    Family Psychiatric  History:  Medical:  family history of heart murmur sickle cell anemia Psych: Patient does not remember Psych Rx: Patient does not remember SA/HA: Patient does not know Substance use family hx: Patient does not know   Social History:  Social History   Substance and Sexual Activity  Alcohol Use Not Currently   Comment: social     Social History   Substance and Sexual Activity  Drug Use Yes   Types: Marijuana   Comment: occ    Social History   Socioeconomic History   Marital status: Single    Spouse name: Not on file   Number of children: Not on file   Years of education: Not on file   Highest education level: Not on file  Occupational History   Not on file  Tobacco Use  Smoking status: Former    Types: Cigars   Smokeless tobacco: Never  Vaping Use   Vaping Use: Never used  Substance and Sexual Activity   Alcohol use: Not Currently    Comment: social   Drug use: Yes    Types: Marijuana    Comment: occ   Sexual  activity: Not Currently    Birth control/protection: None  Other Topics Concern   Not on file  Social History Narrative   Not on file   Social Determinants of Health   Financial Resource Strain: Not on file  Food Insecurity: Not on file  Transportation Needs: Not on file  Physical Activity: Not on file  Stress: Not on file  Social Connections: Not on file   Additional Social History:                         Sleep: Fair  Appetite:  Fair  Current Medications: Current Facility-Administered Medications  Medication Dose Route Frequency Provider Last Rate Last Admin   acetaminophen (TYLENOL) tablet 650 mg  650 mg Oral Q6H PRN Revonda Humphrey, NP   650 mg at 01/08/23 2124   alum & mag hydroxide-simeth (MAALOX/MYLANTA) 200-200-20 MG/5ML suspension 30 mL  30 mL Oral Q4H PRN Revonda Humphrey, NP       benzocaine (ORAJEL) 10 % mucosal gel   Mouth/Throat QID PRN Astin Rape, Ovid Curd, MD       haloperidol (HALDOL) tablet 5 mg  5 mg Oral TID PRN Niasia Lanphear, Ovid Curd, MD       And   LORazepam (ATIVAN) tablet 2 mg  2 mg Oral TID PRN Emalea Mix, Ovid Curd, MD       And   diphenhydrAMINE (BENADRYL) capsule 50 mg  50 mg Oral TID PRN Dougles Kimmey, Ovid Curd, MD       haloperidol lactate (HALDOL) injection 5 mg  5 mg Intramuscular TID PRN Gloria Ricardo, Ovid Curd, MD       And   LORazepam (ATIVAN) injection 2 mg  2 mg Intramuscular TID PRN Axle Parfait, Ovid Curd, MD       And   diphenhydrAMINE (BENADRYL) injection 50 mg  50 mg Intramuscular TID PRN Morley Gaumer, Ovid Curd, MD       FLUoxetine (PROZAC) capsule 20 mg  20 mg Oral Daily Ntuen, Kris Hartmann, FNP   20 mg at 01/09/23 3244   hydrOXYzine (ATARAX) tablet 25 mg  25 mg Oral TID PRN Revonda Humphrey, NP   25 mg at 01/08/23 2125   magnesium hydroxide (MILK OF MAGNESIA) suspension 30 mL  30 mL Oral Daily PRN Revonda Humphrey, NP       OLANZapine (ZYPREXA) tablet 5 mg  5 mg Oral QHS Revonda Humphrey, NP   5 mg at 01/08/23 2124   traZODone (DESYREL) tablet  50 mg  50 mg Oral QHS PRN Revonda Humphrey, NP   50 mg at 01/08/23 2125    Lab Results:  Results for orders placed or performed during the hospital encounter of 01/07/23 (from the past 48 hour(s))  Basic metabolic panel     Status: None   Collection Time: 01/08/23  6:13 PM  Result Value Ref Range   Sodium 139 135 - 145 mmol/L   Potassium 3.7 3.5 - 5.1 mmol/L   Chloride 104 98 - 111 mmol/L   CO2 26 22 - 32 mmol/L   Glucose, Bld 90 70 - 99 mg/dL    Comment: Glucose reference range applies only to samples taken after  fasting for at least 8 hours.   BUN 10 6 - 20 mg/dL   Creatinine, Ser 1.61 0.44 - 1.00 mg/dL   Calcium 9.2 8.9 - 09.6 mg/dL   GFR, Estimated >04 >54 mL/min    Comment: (NOTE) Calculated using the CKD-EPI Creatinine Equation (2021)    Anion gap 9 5 - 15    Comment: Performed at [facility], [address] [place], [place] [address]  Comprehensive metabolic panel     Status: Abnormal   Collection Time: 01/08/23  6:26 PM  Result Value Ref Range   Sodium 140 135 - 145 mmol/L   Potassium 3.8 3.5 - 5.1 mmol/L   Chloride 104 98 - 111 mmol/L   CO2 26 22 - 32 mmol/L   Glucose, Bld 87 70 - 99 mg/dL    Comment: Glucose reference range applies only to samples taken after fasting for at least 8 hours.   BUN 10 6 - 20 mg/dL   Creatinine, Ser 9.14 0.44 - 1.00 mg/dL   Calcium 9.1 8.9 - 78.2 mg/dL   Total Protein 6.5 6.5 - 8.1 g/dL   Albumin 3.4 (L) 3.5 - 5.0 g/dL   AST 19 15 - 41 U/L   ALT 13 0 - 44 U/L   Alkaline Phosphatase 51 38 - 126 U/L   Total Bilirubin 0.7 0.3 - 1.2 mg/dL   GFR, Estimated >95 >62 mL/min    Comment: (NOTE) Calculated using the CKD-EPI Creatinine Equation (2021)    Anion gap 10 5 - 15    Comment: Performed at [facility], [address] [place], [place] [address]    Blood Alcohol level:  Lab Results  Component Value Date   [facility] <10 01/06/2023   ETH <10 09/15/2022    Metabolic Disorder Labs: Lab Results   Component Value Date   HGBA1C 5.2 09/15/2022   MPG 102.54 09/15/2022   MPG 111.15 11/17/2021   Lab Results  Component Value Date   PROLACTIN 14.9 11/17/2021   Lab Results  Component Value Date   CHOL 192 09/15/2022   TRIG 58 09/15/2022   HDL 52 09/15/2022   CHOLHDL 3.7 09/15/2022   VLDL 12 09/15/2022   LDLCALC 128 (H) 09/15/2022   LDLCALC 109 (H) 11/17/2021    Physical Findings: AIMS: Facial and Oral Movements Muscles of Facial Expression: None, normal Lips and Perioral Area: None, normal Jaw: None, normal Tongue: None, normal,Extremity Movements Upper (arms, wrists, hands, fingers): None, normal Lower (legs, knees, ankles, toes): None, normal, Trunk Movements Neck, shoulders, hips: None, normal, Overall Severity Severity of abnormal movements (highest score from questions above): None, normal Incapacitation due to abnormal movements: None, normal Patient's awareness of abnormal movements (rate only patient's report): No Awareness, Dental Status Current problems with teeth and/or dentures?: No Does patient usually wear dentures?: No  CIWA:    COWS:     Musculoskeletal: Strength & Muscle Tone: within normal limits Gait & Station: normal Patient leans: N/A  Psychiatric Specialty Exam:  Presentation  General Appearance:  Casual  Eye Contact: Poor  Speech: Slow  Speech Volume: Decreased  Handedness: Right   Mood and Affect  Mood: Anxious; Depressed; Dysphoric; Hopeless  Affect: Congruent; Tearful   Thought Process  Thought Processes: Linear  Descriptions of Associations:Intact  Orientation:Full (Time, Place and Person)  Thought Content:Logical  History of Schizophrenia/Schizoaffective disorder:No  Duration of Psychotic Symptoms:Greater than six months  Hallucinations:Hallucinations: Auditory; Command Description of Auditory Hallucinations: Patient states she hears the spirit read  the bible to direct her. Description of Visual  Hallucinations: Patient sees some dark shadows at times  Ideas of Reference:Paranoia  Suicidal Thoughts:Suicidal Thoughts: Yes, Passive SI Passive Intent and/or Plan: Without Intent; Without Plan  Homicidal Thoughts:Homicidal Thoughts: No   Sensorium  Memory: Immediate Good; Recent Good; Remote Good  Judgment: Good  Insight: Fair   Community education officer  Concentration: Fair  Attention Span: Fair  Recall: Good  Fund of Knowledge: Good  Language: Good   Psychomotor Activity  Psychomotor Activity: Psychomotor Activity: Normal   Assets  Assets: Communication Skills; Physical Health; Resilience   Sleep  Sleep: Sleep: Fair Number of Hours of Sleep: 4    Physical Exam: Physical Exam Vitals reviewed.  Constitutional:      General: She is not in acute distress.    Appearance: She is not toxic-appearing.  Neurological:     Mental Status: She is alert.    Review of Systems  Constitutional:  Negative for chills and fever.  Cardiovascular:  Negative for chest pain and palpitations.  Neurological:  Positive for headaches.  Psychiatric/Behavioral:  Positive for depression, hallucinations and suicidal ideas. The patient is nervous/anxious.    Blood pressure (!) 134/94, pulse (!) 112, temperature 98.2 F (36.8 C), temperature source Oral, resp. rate 18, height 5\' 8"  (1.727 m), weight 112.5 kg, SpO2 100 %. Body mass index is 37.71 kg/m.   Treatment Plan Summary: Daily contact with patient to assess and evaluate symptoms and progress in treatment and Medication management  ASSESSMENT:  Diagnoses / Active Problems: MDD with psychotic features versus schizoaffective disorder bipolar type GAD PTSD  PLAN: Safety and Monitoring:  --  Voluntary admission to inpatient psychiatric unit for safety, stabilization and treatment  -- Daily contact with patient to assess and evaluate symptoms and progress in treatment  -- Patient's case to be discussed in  multi-disciplinary team meeting  -- Observation Level : q15 minute checks  -- Vital signs:  q12 hours  -- Precautions: suicide, elopement, and assault  2. Psychiatric Diagnoses and Treatment:    -Continue Zyprexa 5 mg nightly for psychotic symptoms and mood stability -Continue Prozac 20 mg once daily for MDD, GAD, PTSD   --  The risks/benefits/side-effects/alternatives to this medication were discussed in detail with the patient and time was given for questions. The patient consents to medication trial.    -- Metabolic profile and EKG monitoring obtained while on an atypical antipsychotic (BMI: Lipid Panel: HbgA1c: QTc:)   -- Encouraged patient to participate in unit milieu and in scheduled group therapies     3. Medical Issues Being Addressed:    4. Discharge Planning:   -- Social work and case management to assist with discharge planning and identification of hospital follow-up needs prior to discharge  -- Estimated LOS: 4-5 days  -- Discharge Concerns: Need to establish a safety plan; Medication compliance and effectiveness  -- Discharge Goals: Return home with outpatient referrals for mental health follow-up including medication management/psychotherapy    Christoper Allegra, MD 01/09/2023, 2:35 PM  Total Time Spent in Direct Patient Care:  I personally spent 35 minutes on the unit in direct patient care. The direct patient care time included face-to-face time with the patient, reviewing the patient's chart, communicating with other professionals, and coordinating care. Greater than 50% of this time was spent in counseling or coordinating care with the patient regarding goals of hospitalization, psycho-education, and discharge planning needs.   Janine Limbo, MD Psychiatrist

## 2023-01-09 NOTE — Group Note (Signed)
LCSW Group Therapy Note  Group Date: 01/09/2023 Start Time: 1100 End Time: 1200   Type of Therapy and Topic:  Group Therapy - Healthy vs Unhealthy Coping Skills  Participation Level:  Active   Description of Group The focus of this group was to determine what unhealthy coping techniques typically are used by group members and what healthy coping techniques would be helpful in coping with various problems. Patients were guided in becoming aware of the differences between healthy and unhealthy coping techniques. Patients were asked to identify 2-3 healthy coping skills they would like to learn to use more effectively.  Therapeutic Goals Patients learned that coping is what human beings do all day long to deal with various situations in their lives Patients defined and discussed healthy vs unhealthy coping techniques Patients identified their preferred coping techniques and identified whether these were healthy or unhealthy Patients determined 2-3 healthy coping skills they would like to become more familiar with and use more often. Patients provided support and ideas to each other   Summary of Patient Progress:  During group, Kristine Garcia expressed personal examples. Patient proved open to input from peers and feedback from [place]. Patient demonstrated good insight into the subject matter, was respectful of peers, and participated throughout the entire session.   Therapeutic Modalities Cognitive Behavioral Therapy Motivational Interviewing  Windle Guard, LCSW 01/09/2023  1:50 PM

## 2023-01-10 DIAGNOSIS — F332 Major depressive disorder, recurrent severe without psychotic features: Secondary | ICD-10-CM | POA: Diagnosis not present

## 2023-01-10 NOTE — [facility] Group Notes (Signed)
Spiritual care group on grief and loss facilitated by chaplain Carrine Kroboth Andree Elk and Lysle Morales, counseling intern.   Group Goal: Support / Education around grief and loss  Members engage in facilitated group support and psycho-social education.  Group Description:  Following introductions and group rules, group members engaged in facilitated group dialogue and support around topic of loss, with particular support around experiences of loss in their lives. Group Identified types of loss (relationships / self / things) and identified patterns, circumstances, and changes that precipitate losses. Reflected on thoughts / feelings around loss, normalized grief responses, and recognized variety in grief experience. Group encouraged individual reflection on safe space and on the coping skills that they are already utilizing.   Group drew on Adlerian / Rogerian and narrative framework  Patient Progress: Kristine Garcia actively engaged in group process.  She was very reflective and offered her own narrative with grief, as well as acknowledged her need for help.  Kristine Garcia shared about the sudden death of her mother when she was 27 years old and how that has currently influenced her relationships today.

## 2023-01-10 NOTE — Group Note (Signed)
Recreation Therapy Group Note   Group Topic:Other  Group Date: 01/10/2023 Start Time: 1400 End Time: 1435 Facilitators: Ivette Castronova-McCall, LRT,CTRS Location: [address] Dayroom   Goal Area(s) Addresses:  Patient will engage in pro-social way in music group.  Patient will follow directions of drum leader on the first prompt. Patient will demonstrate no behavioral issues during group.  Patient will identify if a reduction in stress level occurs as a result of participation in therapeutic drum circle.    Activity Description/Intervention: Therapeutic Drumming. Patients with peers and staff were given the opportunity to engage in a leader facilitated [facility] Empowerment Drumming Circle with staff from the [employer], in partnership with [employer]. Community volunteer and trained drum facilitator, John Beck leading with LRT observing and documenting intervention and pt response. This evidenced-based practice targets 7 areas of health and wellbeing in the human experience including: stress-reduction, exercise, self-expression, camaraderie/support, nurturing, spirituality, and music-making (leisure).    Affect/Mood: Appropriate   Participation Level: Engaged   Participation Quality: Independent   Behavior: Appropriate   Speech/Thought Process: Focused   Insight: Good   Judgement: Good   Modes of Intervention: Community Volunteer and Music   Patient Response to Interventions:  Engaged   Education Outcome:  Acknowledges education and In group clarification offered    Clinical Observations/Individualized Feedback: Pt actively engaged in therapeutic drumming exercise and discussions. Pt was appropriate with peers, staff, and musical equipment for duration of programming.  Pt expressed enjoyment in participating in group session.    Plan: Continue to engage patient in RT group sessions 2-3x/week.   Rileigh Kawashima-McCall, LRT,CTRS 01/10/2023  3:28 PM 

## 2023-01-10 NOTE — Progress Notes (Signed)
Patient did attend the evening speaker NA meeting.  

## 2023-01-10 NOTE — Progress Notes (Addendum)
Patient states she slept well last night. Patient reports her appetite is good and reported pain on her lower back but refused any pain medication. Patient received hot packs instead. Patient denies SI but endorses auditory/visual hallucinations at times. Denies HI or any plan or intent. Patient states her depression is a 5 out of 10 and anxiety a 7. Patient states her goal for today is to work on "self regulation after being triggered. I have a hard time communicating my needs." Patient is med compliant and cooperative on unit.

## 2023-01-10 NOTE — Progress Notes (Signed)
[facility] MD Progress Note  01/10/2023 6:10 PM Shayne Deerman  MRN:  [id number]   Silena Wyss is a single 27 y/o African-American female with past psychiatric history of major depressive disorder, recurrent without psychosis, anxiety disorder, and medical history of vitamin D deficiency who presents voluntarily to [facility] from [facility] due to worsening depression and anxiety symptoms.      Yesterday the psychiatry team made the following recommendations: --Initiate Zyprexa tablet 5 mg p.o. daily at bedtime for psychosis --Initiate Prozac 20 mg p.o. daily for depression starting tomorrow 01/09/2023  Assessment: On my assessment today, patient is seen and examined on [address] lying in her bed.  Reported being sleepy.  Chart reviewed and findings shared with the treatment team and discussed with the attending psychiatrist.  Patient is alert, responsive and oriented to person, place, time, & situation.  Speech clear and coherent.  Mood is less depressed and reports anxiety to be up and down.  Encourage Margaret to ask the nursing staff for as needed for anxiety.  Reports being compliant with her scheduled medication without any side effects. Reports better sleep with Zyprexa and trazodone.  MAR reviewed indicate patient requested for trazodone and hydroxyzine last night at 03-29-2100.  Reports appetite is okay.  Concentration is fair.  Patient denies SI/HI.  However continues to endorse auditory hallucinations of seeing the tall black lady commanding her to kill herself.    Principal Problem: MDD (major depressive disorder), recurrent severe, without psychosis ([place]) Diagnosis: Principal Problem:   MDD (major depressive disorder), recurrent severe, without psychosis ([place]) Active Problems:   PTSD (post-traumatic stress disorder)   GAD (generalized anxiety disorder)  Total Time spent with patient: 15 minutes  Past Psychiatric History:  Previous Psych Diagnoses: Yes, major depressive  disorder Prior inpatient treatment: Patient denies Current/prior outpatient treatment: Patient denies Prior rehab hx: Denies Psychotherapy hx: Patient denies History of suicide: Yes, patient attempted to hang self with a rope in 03-30-2003, when her mother died.  Then in 03/29/05 she attempted to overdose with a bunch of medication, nonspecific any history of blood History of homicide or aggression: Denies Psychiatric medication history: Patient denies Psychiatric medication compliance history: Patient has not started any psychotropic drugs Neuromodulation history: Denies Current Psychiatrist: Patient denies Current therapist: Denies  Past Medical History:  Past Medical History:  Diagnosis Date   Known health problems: none    Vitamin D deficiency     Past Surgical History:  Procedure Laterality Date   NO PAST SURGERIES     Family History:  Family History  Problem Relation Age of Onset   Hypertension Mother    Hypertension Father    Sickle cell trait Maternal Grandmother    Family Psychiatric  History:  Medical:  family history of heart murmur sickle cell anemia Psych: Patient does not remember Psych Rx: Patient does not remember SA/HA: Patient does not know Substance use family hx: Patient does not know   Social History:  Social History   Substance and Sexual Activity  Alcohol Use Not Currently   Comment: social     Social History   Substance and Sexual Activity  Drug Use Yes   Types: Marijuana   Comment: occ    Social History   Socioeconomic History   Marital status: Single    Spouse name: Not on file   Number of children: Not on file   Years of education: Not on file   Highest education level: Not on file  Occupational History   Not on file  Tobacco Use   Smoking status: Former    Types: Cigars   Smokeless tobacco: Never  Vaping Use   Vaping Use: Never used  Substance and Sexual Activity   Alcohol use: Not Currently    Comment: social   Drug use: Yes     Types: Marijuana    Comment: occ   Sexual activity: Not Currently    Birth control/protection: None  Other Topics Concern   Not on file  Social History Narrative   Not on file   Social Determinants of Health   Financial Resource Strain: Not on file  Food Insecurity: Not on file  Transportation Needs: Not on file  Physical Activity: Not on file  Stress: Not on file  Social Connections: Not on file   Additional Social History:    Sleep: Fair  Appetite:  Fair  Current Medications: Current Facility-Administered Medications  Medication Dose Route Frequency Provider Last Rate Last Admin   acetaminophen (TYLENOL) tablet 650 mg  650 mg Oral Q6H PRN Ardis Hughs, NP   650 mg at 01/08/23 2124   alum & mag hydroxide-simeth (MAALOX/MYLANTA) 200-200-20 MG/5ML suspension 30 mL  30 mL Oral Q4H PRN Ardis Hughs, NP       benzocaine (ORAJEL) 10 % mucosal gel   Mouth/Throat QID PRN Massengill, Harrold Donath, MD       haloperidol (HALDOL) tablet 5 mg  5 mg Oral TID PRN Massengill, Harrold Donath, MD       And   LORazepam (ATIVAN) tablet 2 mg  2 mg Oral TID PRN Massengill, Harrold Donath, MD       And   diphenhydrAMINE (BENADRYL) capsule 50 mg  50 mg Oral TID PRN Massengill, Harrold Donath, MD       haloperidol lactate (HALDOL) injection 5 mg  5 mg Intramuscular TID PRN Massengill, Harrold Donath, MD       And   LORazepam (ATIVAN) injection 2 mg  2 mg Intramuscular TID PRN Massengill, Harrold Donath, MD       And   diphenhydrAMINE (BENADRYL) injection 50 mg  50 mg Intramuscular TID PRN Massengill, Harrold Donath, MD       FLUoxetine (PROZAC) capsule 20 mg  20 mg Oral Daily Masyn Rostro, Jesusita Oka, FNP   20 mg at 01/10/23 4401   hydrOXYzine (ATARAX) tablet 25 mg  25 mg Oral TID PRN Ardis Hughs, NP   25 mg at 01/09/23 2101   magnesium hydroxide (MILK OF MAGNESIA) suspension 30 mL  30 mL Oral Daily PRN Ardis Hughs, NP       OLANZapine (ZYPREXA) tablet 5 mg  5 mg Oral QHS Ardis Hughs, NP   5 mg at 01/09/23 2101   traZODone  (DESYREL) tablet 50 mg  50 mg Oral QHS PRN Ardis Hughs, NP   50 mg at 01/09/23 2101    Lab Results:  Results for orders placed or performed during the hospital encounter of 01/07/23 (from the past 48 hour(s))  Basic metabolic panel     Status: None   Collection Time: 01/08/23  6:13 PM  Result Value Ref Range   Sodium 139 135 - 145 mmol/L   Potassium 3.7 3.5 - 5.1 mmol/L   Chloride 104 98 - 111 mmol/L   CO2 26 22 - 32 mmol/L   Glucose, Bld 90 70 - 99 mg/dL    Comment: Glucose reference range applies only to samples taken after fasting for at least 8 hours.   BUN  10 6 - 20 mg/dL   Creatinine, Ser 0.90 0.44 - 1.00 mg/dL   Calcium 9.2 8.9 - 10.3 mg/dL   GFR, Estimated >60 >60 mL/min    Comment: (NOTE) Calculated using the CKD-EPI Creatinine Equation (2021)    Anion gap 9 5 - 15    Comment: Performed at [facility], [place] [address] [place], [place] [address]  Comprehensive metabolic panel     Status: Abnormal   Collection Time: 01/08/23  6:26 PM  Result Value Ref Range   Sodium 140 135 - 145 mmol/L   Potassium 3.8 3.5 - 5.1 mmol/L   Chloride 104 98 - 111 mmol/L   CO2 26 22 - 32 mmol/L   Glucose, Bld 87 70 - 99 mg/dL    Comment: Glucose reference range applies only to samples taken after fasting for at least 8 hours.   BUN 10 6 - 20 mg/dL   Creatinine, Ser 0.93 0.44 - 1.00 mg/dL   Calcium 9.1 8.9 - 10.3 mg/dL   Total Protein 6.5 6.5 - 8.1 g/dL   Albumin 3.4 (L) 3.5 - 5.0 g/dL   AST 19 15 - 41 U/L   ALT 13 0 - 44 U/L   Alkaline Phosphatase 51 38 - 126 U/L   Total Bilirubin 0.7 0.3 - 1.2 mg/dL   GFR, Estimated >60 >60 mL/min    Comment: (NOTE) Calculated using the CKD-EPI Creatinine Equation (2021)    Anion gap 10 5 - 15    Comment: Performed at [facility], [place] [address], [place],  [address]    Blood Alcohol level:  Lab Results  Component Value Date   [facility] <10 01/06/2023   ETH <10 50/53/9767    Metabolic Disorder  Labs: Lab Results  Component Value Date   HGBA1C 5.2 09/15/2022   MPG 102.54 09/15/2022   MPG 111.15 11/17/2021   Lab Results  Component Value Date   PROLACTIN 14.9 11/17/2021   Lab Results  Component Value Date   CHOL 192 09/15/2022   TRIG 58 09/15/2022   HDL 52 09/15/2022   CHOLHDL 3.7 09/15/2022   VLDL 12 09/15/2022   LDLCALC 128 (H) 09/15/2022   LDLCALC 109 (H) 11/17/2021    Physical Findings: AIMS: Facial and Oral Movements Muscles of Facial Expression: None, normal Lips and Perioral Area: None, normal Jaw: None, normal Tongue: None, normal,Extremity Movements Upper (arms, wrists, hands, fingers): None, normal Lower (legs, knees, ankles, toes): None, normal, Trunk Movements Neck, shoulders, hips: None, normal, Overall Severity Severity of abnormal movements (highest score from questions above): None, normal Incapacitation due to abnormal movements: None, normal Patient's awareness of abnormal movements (rate only patient's report): No Awareness, Dental Status Current problems with teeth and/or dentures?: No Does patient usually wear dentures?: No  CIWA:    COWS:     Musculoskeletal: Strength & Muscle Tone: within normal limits Gait & Station: normal Patient leans: N/A  Psychiatric Specialty Exam:  Presentation  General Appearance:  Casual  Eye Contact: Fair  Speech: Clear and Coherent  Speech Volume: Normal  Handedness: Right  Mood and Affect  Mood: Anxious; Depressed; Hopeless  Affect: Congruent  Thought Process  Thought Processes: Coherent  Descriptions of Associations:Intact  Orientation:Full (Time, Place and Person)  Thought Content:Logical; Paranoid Ideation  History of Schizophrenia/Schizoaffective disorder:No  Duration of Psychotic Symptoms:Greater than six months  Hallucinations:Hallucinations: Auditory; Command Description of Command Hallucinations: A black lady commanding her to kill herself Description of Auditory  Hallucinations: Hearing black lady  telling her to kill herself Description of Visual Hallucinations: Denies today  Ideas of Reference:Paranoia  Suicidal Thoughts:Suicidal Thoughts: No SI Passive Intent and/or Plan: -- (Denies)  Homicidal Thoughts:Homicidal Thoughts: No  Sensorium  Memory: Immediate Fair; Recent Fair; Remote Fair  Judgment: Fair  Insight: Fair  Community education officer  Concentration: Fair  Attention Span: Fair  Recall: [place] of Knowledge: Fair  Language: Good  Psychomotor Activity  Psychomotor Activity: Psychomotor Activity: Normal  Assets  Assets: Communication Skills; Physical Health; Resilience  Sleep  Sleep: Sleep: Good Number of Hours of Sleep: 5  Physical Exam: Physical Exam Vitals reviewed.  Constitutional:      General: She is not in acute distress.    Appearance: She is not toxic-appearing.  Neurological:     Mental Status: She is alert.  Psychiatric:        Behavior: Behavior normal.    Review of Systems  Constitutional:  Negative for chills and fever.  Cardiovascular:  Negative for chest pain and palpitations.  Psychiatric/Behavioral:  Positive for depression and hallucinations. The patient is nervous/anxious.    Blood pressure 111/72, pulse 83, temperature 98.8 F (37.1 C), temperature source Oral, resp. rate 16, height 5\' 8"  (1.727 m), weight 112.5 kg, SpO2 100 %. Body mass index is 37.71 kg/m.  Treatment Plan Summary: Daily contact with patient to assess and evaluate symptoms and progress in treatment and Medication management  ASSESSMENT:  Diagnoses / Active Problems: MDD with psychotic features versus schizoaffective disorder bipolar type GAD PTSD  PLAN: Safety and Monitoring:  --  Voluntary admission to inpatient psychiatric unit for safety, stabilization and treatment  -- Daily contact with patient to assess and evaluate symptoms and progress in treatment  -- Patient's case to be discussed in  multi-disciplinary team meeting  -- Observation Level : q15 minute checks  -- Vital signs:  q12 hours  -- Precautions: suicide, elopement, and assault  2. Psychiatric Diagnoses and Treatment:    -Continue Zyprexa 5 mg nightly for psychotic symptoms and mood stability -Continue Prozac 20 mg once daily for MDD, GAD, PTSD   --  The risks/benefits/side-effects/alternatives to this medication were discussed in detail with the patient and time was given for questions. The patient consents to medication trial.    -- Metabolic profile and EKG monitoring obtained while on an atypical antipsychotic (BMI: Lipid Panel: HbgA1c: QTc:)   -- Encouraged patient to participate in unit milieu and in scheduled group therapies     3. Medical Issues Being Addressed:   4. Discharge Planning:   -- Social work and case management to assist with discharge planning and identification of hospital follow-up needs prior to discharge  -- Estimated LOS: 4-5 days  -- Discharge Concerns: Need to establish a safety plan; Medication compliance and effectiveness  -- Discharge Goals: Return home with outpatient referrals for mental health follow-up including medication management/psychotherapy  Laretta Bolster, [place] 01/10/2023, 6:10 PM   Patient ID: [name], female   DOB: [birth year], 27 y.o.   MRN: [id number]   I discussed the case with the APP, and I agree with the assessment and plan of care as documented in the APP's note , as addended by me or notated below: Agree with plan  Janine Limbo, MD Psychiatrist

## 2023-01-10 NOTE — Plan of Care (Signed)
  Problem: Activity: Goal: Sleeping patterns will improve Outcome: Progressing   Problem: Health Behavior/Discharge Planning: Goal: Compliance with treatment plan for underlying cause of condition will improve Outcome: Progressing   Problem: Safety: Goal: Periods of time without injury will increase Outcome: Progressing   

## 2023-01-11 MED ORDER — POLYETHYLENE GLYCOL 3350 17 G PO PACK: 17.0000 g | PACK | ORAL | Status: DC | PRN

## 2023-01-11 MED ORDER — FLUOXETINE HCL 10 MG PO CAPS
30.0000 mg | ORAL_CAPSULE | Freq: Every day | ORAL | Status: DC
  Administered 2023-01-12: 30 mg via ORAL
  Filled 2023-01-11 (×3): qty 3

## 2023-01-11 MED ORDER — POLYETHYLENE GLYCOL 3350 17 G PO PACK
17.0000 g | PACK | Freq: Two times a day (BID) | ORAL | Status: DC
  Filled 2023-01-11 (×2): qty 1

## 2023-01-11 MED ORDER — SALINE SPRAY 0.65 % NA SOLN: 1.0000 | NASAL | Status: DC | PRN

## 2023-01-11 NOTE — Group Note (Signed)
LCSW Group Therapy Note   Group Date: 01/11/2023 Start Time: 1100 End Time: 1200   Type of Therapy and Topic:  Group Therapy: Boundaries  Participation Level:  Active  Description of Group: This group will address the use of boundaries in their personal lives. Patients will explore why boundaries are important, the difference between healthy and unhealthy boundaries, and negative and postive outcomes of different boundaries and will look at how boundaries can be crossed.  Patients will be encouraged to identify current boundaries in their own lives and identify what kind of boundary is being set. Facilitators will guide patients in utilizing problem-solving interventions to address and correct types boundaries being used and to address when no boundary is being used. Understanding and applying boundaries will be explored and addressed for obtaining and maintaining a balanced life. Patients will be encouraged to explore ways to assertively make their boundaries and needs known to significant others in their lives, using other group members and facilitator for role play, support, and feedback.  Therapeutic Goals:  1.  Patient will identify areas in their life where setting clear boundaries could be  used to improve their life.  2.  Patient will identify signs/triggers that a boundary is not being respected. 3.  Patient will identify two ways to set boundaries in order to achieve balance in  their lives: 4.  Patient will demonstrate ability to communicate their needs and set boundaries  through discussion and/or role plays  Summary of Patient Progress:  Kristine Garcia was present/active throughout the session and proved open to feedback from [place] and peers. Patient demonstrated good insight into the subject matter, was respectful of peers, and was present throughout the entire session.  Therapeutic Modalities:   Cognitive Behavioral Therapy Solution-Focused Therapy  Windle Guard, LCSW 01/11/2023  3:01  PM

## 2023-01-11 NOTE — Progress Notes (Signed)
   01/11/23 1000  Psych Admission Type (Psych Patients Only)  Admission Status Voluntary  Psychosocial Assessment  Patient Complaints Anxiety  Eye Contact Poor  Facial Expression Anxious  Affect Depressed  Speech Logical/coherent  Interaction Assertive  Motor Activity Slow  Appearance/Hygiene Unremarkable  Behavior Characteristics Appropriate to situation  Mood Depressed  Thought Process  Coherency Circumstantial  Content Blaming others  Delusions None reported or observed  Perception Hallucinations  Hallucination Visual;Auditory  Judgment Limited  Confusion None  Danger to Self  Current suicidal ideation? Denies  Danger to Others  Danger to Others None reported or observed

## 2023-01-11 NOTE — Progress Notes (Signed)
   01/10/23 2120  Psych Admission Type (Psych Patients Only)  Admission Status Voluntary  Psychosocial Assessment  Patient Complaints Anxiety  Eye Contact Fair  Facial Expression Flat  Affect Depressed;Flat  Speech Logical/coherent  Interaction Assertive  Motor Activity Slow  Appearance/Hygiene Unremarkable  Behavior Characteristics Appropriate to situation;Cooperative  Mood Depressed;Anxious  Thought Process  Coherency Circumstantial  Content Blaming others  Delusions None reported or observed  Perception Hallucinations  Hallucination Visual;Auditory  Judgment Limited  Confusion None  Danger to Self  Current suicidal ideation? Denies  Agreement Not to Harm Self Yes  Description of Agreement Verbal  Danger to Others  Danger to Others None reported or observed

## 2023-01-11 NOTE — Progress Notes (Signed)
[facility] MD Progress Note  01/11/2023 10:46 AM Kristine Garcia  MRN:  [id number]   Kristine Garcia is a single 27 y/o African-American female with past psychiatric history of major depressive disorder, recurrent without psychosis, anxiety disorder, and medical history of vitamin D deficiency who presents voluntarily to [facility] from [facility] due to worsening depression and anxiety symptoms.      Yesterday the psychiatry team made the following recommendations: -Continue Zyprexa 5 mg nightly for psychotic symptoms and mood stability -Continue Prozac 20 mg once daily for MDD, GAD, PTSD  Assessment: On my assessment today, patient is seen and examined on [address] lying in her bed.  Reported being sleepy.  Chart reviewed and findings shared with the treatment team and discussed with the attending psychiatrist.  Patient is alert, responsive and oriented to person, place, time, & situation.  Speech clear and coherent.  Mood is less depressed and reports anxiety to be up and down.  Encourage Kristine Garcia to ask the nursing staff for as needed for anxiety.  Reports being compliant with her scheduled medication without any side effects. Reports better sleep with Zyprexa and trazodone.  MAR reviewed indicate patient requested for trazodone and hydroxyzine last night at 2100-04-03.  Reports appetite is okay.  Concentration is fair.  Patient denies SI/HI.  However continues to endorse auditory hallucinations of seeing the tall black lady commanding her to kill herself.    Principal Problem: MDD (major depressive disorder), recurrent severe, without psychosis ([place]) Diagnosis: Principal Problem:   MDD (major depressive disorder), recurrent severe, without psychosis ([place]) Active Problems:   PTSD (post-traumatic stress disorder)   GAD (generalized anxiety disorder)  Total Time spent with patient: 15 minutes  Past Psychiatric History:  Previous Psych Diagnoses: Yes, major depressive disorder Prior  inpatient treatment: Patient denies Current/prior outpatient treatment: Patient denies Prior rehab hx: Denies Psychotherapy hx: Patient denies History of suicide: Yes, patient attempted to hang self with a rope in 2003-04-04, when her mother died.  Then in 2005/04/03 she attempted to overdose with a bunch of medication, nonspecific any history of blood History of homicide or aggression: Denies Psychiatric medication history: Patient denies Psychiatric medication compliance history: Patient has not started any psychotropic drugs Neuromodulation history: Denies Current Psychiatrist: Patient denies Current therapist: Denies  Past Medical History:  Past Medical History:  Diagnosis Date   Known health problems: none    Vitamin D deficiency     Past Surgical History:  Procedure Laterality Date   NO PAST SURGERIES     Family History:  Family History  Problem Relation Age of Onset   Hypertension Mother    Hypertension Father    Sickle cell trait Maternal Grandmother    Family Psychiatric  History:  Medical:  family history of heart murmur sickle cell anemia Psych: Patient does not remember Psych Rx: Patient does not remember SA/HA: Patient does not know Substance use family hx: Patient does not know   Social History:  Social History   Substance and Sexual Activity  Alcohol Use Not Currently   Comment: social     Social History   Substance and Sexual Activity  Drug Use Yes   Types: Marijuana   Comment: occ    Social History   Socioeconomic History   Marital status: Single    Spouse name: Not on file   Number of children: Not on file   Years of education: Not on file   Highest education level: Not on file  Occupational  History   Not on file  Tobacco Use   Smoking status: Former    Types: Cigars   Smokeless tobacco: Never  Vaping Use   Vaping Use: Never used  Substance and Sexual Activity   Alcohol use: Not Currently    Comment: social   Drug use: Yes    Types:  Marijuana    Comment: occ   Sexual activity: Not Currently    Birth control/protection: None  Other Topics Concern   Not on file  Social History Narrative   Not on file   Social Determinants of Health   Financial Resource Strain: Not on file  Food Insecurity: Not on file  Transportation Needs: Not on file  Physical Activity: Not on file  Stress: Not on file  Social Connections: Not on file   Additional Social History:    Sleep: Fair  Appetite:  Fair  Current Medications: Current Facility-Administered Medications  Medication Dose Route Frequency Provider Last Rate Last Admin   acetaminophen (TYLENOL) tablet 650 mg  650 mg Oral Q6H PRN Revonda Humphrey, NP   650 mg at 01/08/23 2124   alum & mag hydroxide-simeth (MAALOX/MYLANTA) 200-200-20 MG/5ML suspension 30 mL  30 mL Oral Q4H PRN Revonda Humphrey, NP       benzocaine (ORAJEL) 10 % mucosal gel   Mouth/Throat QID PRN Massengill, Ovid Curd, MD       haloperidol (HALDOL) tablet 5 mg  5 mg Oral TID PRN Massengill, Ovid Curd, MD       And   LORazepam (ATIVAN) tablet 2 mg  2 mg Oral TID PRN Massengill, Ovid Curd, MD       And   diphenhydrAMINE (BENADRYL) capsule 50 mg  50 mg Oral TID PRN Massengill, Ovid Curd, MD       haloperidol lactate (HALDOL) injection 5 mg  5 mg Intramuscular TID PRN Massengill, Ovid Curd, MD       And   LORazepam (ATIVAN) injection 2 mg  2 mg Intramuscular TID PRN Massengill, Ovid Curd, MD       And   diphenhydrAMINE (BENADRYL) injection 50 mg  50 mg Intramuscular TID PRN Massengill, Ovid Curd, MD       FLUoxetine (PROZAC) capsule 20 mg  20 mg Oral Daily Ntuen, Tina C, FNP   20 mg at 01/10/23 2671   hydrOXYzine (ATARAX) tablet 25 mg  25 mg Oral TID PRN Revonda Humphrey, NP   25 mg at 01/10/23 2139   magnesium hydroxide (MILK OF MAGNESIA) suspension 30 mL  30 mL Oral Daily PRN Revonda Humphrey, NP       OLANZapine (ZYPREXA) tablet 5 mg  5 mg Oral QHS Thomes Lolling H, NP   5 mg at 01/10/23 2139   sodium chloride  (OCEAN) 0.65 % nasal spray 1 spray  1 spray Each Nare PRN Massengill, Ovid Curd, MD       traZODone (DESYREL) tablet 50 mg  50 mg Oral QHS PRN Revonda Humphrey, NP   50 mg at 01/10/23 2139    Lab Results:  No results found for this or any previous visit (from the past 78 hour(s)).   Blood Alcohol level:  Lab Results  Component Value Date   [facility] <10 01/06/2023   ETH <10 24/58/0998    Metabolic Disorder Labs: Lab Results  Component Value Date   HGBA1C 5.2 09/15/2022   MPG 102.54 09/15/2022   MPG 111.15 11/17/2021   Lab Results  Component Value Date   PROLACTIN 14.9 11/17/2021   Lab Results  Component Value Date   CHOL 192 09/15/2022   TRIG 58 09/15/2022   HDL 52 09/15/2022   CHOLHDL 3.7 09/15/2022   VLDL 12 09/15/2022   LDLCALC 128 (H) 09/15/2022   LDLCALC 109 (H) 11/17/2021    Physical Findings: AIMS: Facial and Oral Movements Muscles of Facial Expression: None, normal Lips and Perioral Area: None, normal Jaw: None, normal Tongue: None, normal,Extremity Movements Upper (arms, wrists, hands, fingers): None, normal Lower (legs, knees, ankles, toes): None, normal, Trunk Movements Neck, shoulders, hips: None, normal, Overall Severity Severity of abnormal movements (highest score from questions above): None, normal Incapacitation due to abnormal movements: None, normal Patient's awareness of abnormal movements (rate only patient's report): No Awareness, Dental Status Current problems with teeth and/or dentures?: No Does patient usually wear dentures?: No   Musculoskeletal: Strength & Muscle Tone: within normal limits Gait & Station: normal Patient leans: N/A  Psychiatric Specialty Exam:  Presentation  General Appearance:  Casual  Eye Contact: Fair  Speech: Clear and Coherent  Speech Volume: Normal  Handedness: Right  Mood and Affect  Mood: Anxious; Depressed; Hopeless  Affect: Congruent  Thought Process  Thought  Processes: Coherent  Descriptions of Associations:Intact  Orientation:Full (Time, Place and Person)  Thought Content:Logical; Paranoid Ideation  History of Schizophrenia/Schizoaffective disorder:No  Duration of Psychotic Symptoms:Greater than six months  Hallucinations:Hallucinations: Auditory; Command Description of Command Hallucinations: A black lady commanding her to kill herself Description of Auditory Hallucinations: Hearing black lady telling her to kill herself Description of Visual Hallucinations: Denies today  Ideas of Reference:Paranoia  Suicidal Thoughts:Suicidal Thoughts: No SI Passive Intent and/or Plan: -- (Denies)  Homicidal Thoughts:Homicidal Thoughts: No  Sensorium  Memory: Immediate Fair; Recent Fair; Remote Fair  Judgment: Fair  Insight: Fair  Community education officer  Concentration: Fair  Attention Span: Fair  Recall: [place] of Knowledge: Fair  Language: Good  Psychomotor Activity  Psychomotor Activity: Psychomotor Activity: Normal  Assets  Assets: Communication Skills; Physical Health; Resilience  Sleep  Sleep: Sleep: Good Number of Hours of Sleep: 5  Physical Exam: Physical Exam Vitals and nursing note reviewed.  Constitutional:      General: She is not in acute distress.    Appearance: She is not ill-appearing, toxic-appearing or diaphoretic.  HENT:     Head: Normocephalic.  Pulmonary:     Effort: Pulmonary effort is normal. No respiratory distress.  Neurological:     Mental Status: She is alert.  Psychiatric:        Behavior: Behavior normal.    Review of Systems  Constitutional:  Negative for chills, fever and malaise/fatigue.  Respiratory:  Negative for shortness of breath.   Cardiovascular:  Negative for chest pain and palpitations.  Gastrointestinal:  Positive for constipation. Negative for abdominal pain, diarrhea, nausea and vomiting.  Musculoskeletal:  Negative for myalgias.  Neurological:  Positive  for tremors. Negative for dizziness and headaches.   Blood pressure (!) 147/94, pulse 84, temperature 98.3 F (36.8 C), temperature source Oral, resp. rate 16, height 5\' 8"  (1.727 m), weight 112.5 kg, SpO2 100 %. Body mass index is 37.71 kg/m.  Treatment Plan Summary: Daily contact with patient to assess and evaluate symptoms and progress in treatment and Medication management  ASSESSMENT:  Diagnoses / Active Problems: MDD with psychotic features versus schizoaffective disorder bipolar type GAD PTSD  PLAN: Safety and Monitoring:  --  Voluntary admission to inpatient psychiatric unit for safety, stabilization and treatment  -- Daily contact with patient to assess and evaluate symptoms and  progress in treatment  -- Patient's case to be discussed in multi-disciplinary team meeting  -- Observation Level : q15 minute checks  -- Vital signs:  q12 hours  -- Precautions: suicide, elopement, and assault  2. Psychiatric Diagnoses and Treatment:    -Continue Zyprexa 5 mg nightly for psychotic symptoms and mood stability -Continue Prozac 20 mg once daily for MDD, GAD, PTSD  Plan to increase to prozac 30 mg daily - first dose 2/9  Then to prozac 40 mg daily - first dose 2/10   --  The risks/benefits/side-effects/alternatives to this medication were discussed in detail with the patient and time was given for questions. The patient consents to medication trial.    -- Metabolic profile and EKG monitoring obtained while on an atypical antipsychotic (BMI: Lipid Panel: HbgA1c: QTc:)   -- Encouraged patient to participate in unit milieu and in scheduled group therapies     3. Medical Issues Being Addressed:   4. Discharge Planning:   -- Social work and case management to assist with discharge planning and identification of hospital follow-up needs prior to discharge  -- Estimated LOS: 4-5 days  -- Discharge Concerns: Need to establish a safety plan; Medication compliance and effectiveness  --  Discharge Goals: Return home with outpatient referrals for mental health follow-up including medication management/psychotherapy  Princess Bruins, [id number] 01/11/2023, 10:46 AM   Patient ID: Kristine Garcia, female   DOB: 02-24-1996, 27 y.o.   MRN: [id number]

## 2023-01-11 NOTE — Progress Notes (Signed)
   01/11/23 0615  15 Minute Checks  Location Bedroom  Visual Appearance Calm  Behavior Composed  Sleep (Behavioral Health Patients Only)  Calculate sleep? (Click Yes once per 24 hr at 0600 safety check) Yes  Documented sleep last 24 hours 9.75

## 2023-01-11 NOTE — [facility] Group Notes (Signed)
Pt attended wrap up group 

## 2023-01-11 NOTE — Plan of Care (Signed)
  Problem: Education: Goal: Mental status will improve Outcome: Progressing   Problem: Activity: Goal: Sleeping patterns will improve Outcome: Progressing   Problem: Safety: Goal: Periods of time without injury will increase Outcome: Progressing   Problem: Medication: Goal: Compliance with prescribed medication regimen will improve Outcome: Progressing   Problem: Coping: Goal: Will verbalize feelings Outcome: Progressing   Problem: Self-Concept: Goal: Will verbalize positive feelings about self Outcome: Progressing

## 2023-01-11 NOTE — Progress Notes (Signed)
Pt denies SI/HI. Pt said she sees angels and spirits and also shadows.  Pt said hallucinations have been going on with her "forever." Pt slept well last night.  No immediate concerns are identified at this time.  Will continue to monitor and provide support bas needed.

## 2023-01-12 ENCOUNTER — Encounter (HOSPITAL_COMMUNITY): Payer: Self-pay

## 2023-01-12 MED ORDER — METHOCARBAMOL 500 MG PO TABS
500.0000 mg | ORAL_TABLET | Freq: Four times a day (QID) | ORAL | Status: DC | PRN
  Administered 2023-01-12: 500 mg via ORAL
  Filled 2023-01-12 (×2): qty 1

## 2023-01-12 MED ORDER — FLUOXETINE HCL 20 MG PO CAPS
40.0000 mg | ORAL_CAPSULE | Freq: Every day | ORAL | Status: DC
  Administered 2023-01-13 – 2023-01-16 (×4): 40 mg via ORAL
  Filled 2023-01-12 (×5): qty 2

## 2023-01-12 NOTE — Group Note (Signed)
Date:  01/12/2023 Time:  4:20 PM  Group Topic/Focus:  Orientation:   The focus of this group is to educate the patient on the purpose and policies of crisis stabilization and provide a format to answer questions about their admission.  The group details unit policies and expectations of patients while admitted.    Participation Level:  Did Not Attend  Participation Quality:      Affect:      Cognitive:      Insight: None  Engagement in Group:      Modes of Intervention:      Additional Comments:     Jerrye Beavers 01/12/2023, 4:20 PM

## 2023-01-12 NOTE — Progress Notes (Signed)
Adult Psychoeducational Group Note  Date:  01/12/2023 Time:  9:23 PM  Group Topic/Focus:  Wrap-Up Group:   The focus of this group is to help patients review their daily goal of treatment and discuss progress on daily workbooks.  Participation Level:  Active  Participation Quality:  Appropriate  Affect:  Appropriate  Cognitive:  Appropriate  Insight: Appropriate  Engagement in Group:  Engaged  Modes of Intervention:  Discussion  Additional Comments:  Phuong attend AA wrap up group  Lenice Llamas Long 01/12/2023, 9:23 PM

## 2023-01-12 NOTE — Progress Notes (Addendum)
D: Pt denied SI/HI/AVH this morning. Pt rated her depression a 5/10, anxiety a 5/10, and feelings of hopelessness a 5/10. Pt reports 3/10 chronic pain in her mid-upper back. Pt asked RN about being prescribed Gabapentin for her chronic back pain, RN notified MD of patient's request. Pt has been pleasant, calm, and cooperative throughout the shift.   A: RN provided support and encouragement to patient. Pt given scheduled medications as prescribed. PRN Robaxin given for back spasms. Q15 min checks verified for safety.    R: Patient verbally contracts for safety. Patient compliant with medications and treatment plan. Patient is interacting well on the unit. Pt is safe on the unit.   01/12/23 1100  Psych Admission Type (Psych Patients Only)  Admission Status Voluntary  Psychosocial Assessment  Patient Complaints Anxiety;Depression  Eye Contact Fair  Facial Expression Flat  Affect Appropriate to circumstance  Speech Logical/coherent  Interaction Assertive  Motor Activity Other (Comment) (WDL)  Appearance/Hygiene Unremarkable  Behavior Characteristics Cooperative;Appropriate to situation  Mood Depressed  Thought Process  Coherency Circumstantial  Content WDL  Delusions None reported or observed  Perception WDL  Hallucination None reported or observed  Judgment Impaired  Confusion None  Danger to Self  Current suicidal ideation? Denies  Agreement Not to Harm Self Yes  Description of Agreement Pt verbally contracts for safety  Danger to Others  Danger to Others None reported or observed

## 2023-01-12 NOTE — Progress Notes (Signed)
   01/11/23 2200  Psych Admission Type (Psych Patients Only)  Admission Status Voluntary  Psychosocial Assessment  Patient Complaints None  Eye Contact Fair  Facial Expression Animated  Affect Appropriate to circumstance  Speech Logical/coherent  Interaction Assertive  Motor Activity Slow  Appearance/Hygiene Unremarkable  Behavior Characteristics Appropriate to situation;Cooperative  Mood Pleasant  Thought Process  Coherency Circumstantial  Content Blaming others  Delusions None reported or observed  Perception WDL  Hallucination None reported or observed  Judgment Limited  Confusion None  Danger to Self  Current suicidal ideation? Denies  Agreement Not to Harm Self Yes  Description of Agreement verbal  Danger to Others  Danger to Others None reported or observed

## 2023-01-12 NOTE — [facility] Group Notes (Signed)
Patient attended and contributed to group

## 2023-01-12 NOTE — BH IP Treatment Plan (Signed)
Interdisciplinary Treatment and Diagnostic Plan Update  01/12/2023 Time of Session: [place] MRN: [id number]  Principal Diagnosis: MDD (major depressive disorder), recurrent severe, without psychosis ([place])  Secondary Diagnoses: Principal Problem:   MDD (major depressive disorder), recurrent severe, without psychosis ([place]) Active Problems:   PTSD (post-traumatic stress disorder)   GAD (generalized anxiety disorder)   Current Medications:  Current Facility-Administered Medications  Medication Dose Route Frequency Provider Last Rate Last Admin   acetaminophen (TYLENOL) tablet 650 mg  650 mg Oral Q6H PRN Revonda Humphrey, NP   650 mg at 01/08/23 2124   alum & mag hydroxide-simeth (MAALOX/MYLANTA) 200-200-20 MG/5ML suspension 30 mL  30 mL Oral Q4H PRN Revonda Humphrey, NP       benzocaine (ORAJEL) 10 % mucosal gel   Mouth/Throat QID PRN Massengill, Ovid Curd, MD       haloperidol (HALDOL) tablet 5 mg  5 mg Oral TID PRN Janine Limbo, MD       And   LORazepam (ATIVAN) tablet 2 mg  2 mg Oral TID PRN Janine Limbo, MD       And   diphenhydrAMINE (BENADRYL) capsule 50 mg  50 mg Oral TID PRN Massengill, Ovid Curd, MD       haloperidol lactate (HALDOL) injection 5 mg  5 mg Intramuscular TID PRN Massengill, Ovid Curd, MD       And   LORazepam (ATIVAN) injection 2 mg  2 mg Intramuscular TID PRN Massengill, Ovid Curd, MD       And   diphenhydrAMINE (BENADRYL) injection 50 mg  50 mg Intramuscular TID PRN Massengill, Ovid Curd, MD       FLUoxetine (PROZAC) capsule 30 mg  30 mg Oral Daily Merrily Brittle, DO   30 mg at 01/12/23 0753   hydrOXYzine (ATARAX) tablet 25 mg  25 mg Oral TID PRN Revonda Humphrey, NP   25 mg at 01/10/23 2139   magnesium hydroxide (MILK OF MAGNESIA) suspension 30 mL  30 mL Oral Daily PRN Revonda Humphrey, NP       OLANZapine (ZYPREXA) tablet 5 mg  5 mg Oral QHS Revonda Humphrey, NP   5 mg at 01/11/23 2116   polyethylene glycol (MIRALAX / GLYCOLAX) packet 17 g  17 g  Oral Q1H PRN Merrily Brittle, DO       sodium chloride (OCEAN) 0.65 % nasal spray 1 spray  1 spray Each Nare PRN Massengill, Ovid Curd, MD       traZODone (DESYREL) tablet 50 mg  50 mg Oral QHS PRN Revonda Humphrey, NP   50 mg at 01/11/23 2117   PTA Medications: Medications Prior to Admission  Medication Sig Dispense Refill Last Dose   gabapentin (NEURONTIN) 300 MG capsule Take 300 mg by mouth daily as needed (pain).      Ketoprofen (FROTEK) 10 % CREA Apply 1 Application topically 3 (three) times daily as needed (pain).       Patient Stressors: Financial difficulties   Loss of mother at age 61   Other: homelessness    Patient Strengths: Ability for insight  Hydrographic surveyor for treatment/growth  Physical Health   Treatment Modalities: Medication Management, Group therapy, Case management,  1 to 1 session with clinician, Psychoeducation, Recreational therapy.   Physician Treatment Plan for Primary Diagnosis: MDD (major depressive disorder), recurrent severe, without psychosis ([place]) Long Term Goal(s): Improvement in symptoms so as ready for discharge   Short Term Goals: Ability to identify changes in lifestyle to reduce recurrence of condition  will improve Ability to verbalize feelings will improve Ability to disclose and discuss suicidal ideas Ability to demonstrate self-control will improve Ability to identify and develop effective coping behaviors will improve Ability to maintain clinical measurements within normal limits will improve Compliance with prescribed medications will improve Ability to identify triggers associated with substance abuse/mental health issues will improve  Medication Management: Evaluate patient's response, side effects, and tolerance of medication regimen.  Therapeutic Interventions: 1 to 1 sessions, Unit Group sessions and Medication administration.  Evaluation of Outcomes: Progressing  Physician Treatment Plan for Secondary Diagnosis:  Principal Problem:   MDD (major depressive disorder), recurrent severe, without psychosis ([place]) Active Problems:   PTSD (post-traumatic stress disorder)   GAD (generalized anxiety disorder)  Long Term Goal(s): Improvement in symptoms so as ready for discharge   Short Term Goals: Ability to identify changes in lifestyle to reduce recurrence of condition will improve Ability to verbalize feelings will improve Ability to disclose and discuss suicidal ideas Ability to demonstrate self-control will improve Ability to identify and develop effective coping behaviors will improve Ability to maintain clinical measurements within normal limits will improve Compliance with prescribed medications will improve Ability to identify triggers associated with substance abuse/mental health issues will improve     Medication Management: Evaluate patient's response, side effects, and tolerance of medication regimen.  Therapeutic Interventions: 1 to 1 sessions, Unit Group sessions and Medication administration.  Evaluation of Outcomes: Progressing   RN Treatment Plan for Primary Diagnosis: MDD (major depressive disorder), recurrent severe, without psychosis ([place]) Long Term Goal(s): Knowledge of disease and therapeutic regimen to maintain health will improve  Short Term Goals: Ability to remain free from injury will improve, Ability to verbalize frustration and anger appropriately will improve, Ability to demonstrate self-control, Ability to participate in decision making will improve, Ability to verbalize feelings will improve, Ability to disclose and discuss suicidal ideas, Ability to identify and develop effective coping behaviors will improve, and Compliance with prescribed medications will improve  Medication Management: RN will administer medications as ordered by provider, will assess and evaluate patient's response and provide education to patient for prescribed medication. RN will report any adverse  and/or side effects to prescribing provider.  Therapeutic Interventions: 1 on 1 counseling sessions, Psychoeducation, Medication administration, Evaluate responses to treatment, Monitor vital signs and CBGs as ordered, Perform/monitor CIWA, COWS, AIMS and Fall Risk screenings as ordered, Perform wound care treatments as ordered.  Evaluation of Outcomes: Progressing   LCSW Treatment Plan for Primary Diagnosis: MDD (major depressive disorder), recurrent severe, without psychosis ([place]) Long Term Goal(s): Safe transition to appropriate next level of care at discharge, Engage patient in therapeutic group addressing interpersonal concerns.  Short Term Goals: Engage patient in aftercare planning with referrals and resources  Therapeutic Interventions: Assess for all discharge needs, 1 to 1 time with Social worker, Explore available resources and support systems, Assess for adequacy in community support network, Educate family and significant other(s) on suicide prevention, Complete Psychosocial Assessment, Interpersonal group therapy.  Evaluation of Outcomes: Progressing   Progress in Treatment: Attending groups: Yes. and No. Participating in groups: Yes. Taking medication as prescribed: Yes. Toleration medication: Yes. Family/Significant other contact made: Yes, individual(s) contacted:  Milinda Hirschfeld [phone] (Friend) Patient understands diagnosis: Yes. Discussing patient identified problems/goals with staff: Yes. Medical problems stabilized or resolved: Yes. Denies suicidal/homicidal ideation: Yes. Issues/concerns per patient self-inventory: Yes. Other: No  New problem(s) identified: No, Describe:  None  New Short Term/Long Term Goal(s):medication stabilization, elimination of SI thoughts,  development of comprehensive mental wellness plan.   Patient Goals:  Medication Stabilization  Discharge Plan or Barriers: : Patient recently admitted. CSW will continue to follow and  assess for appropriate referrals and possible discharge planning.     Reason for Continuation of Hospitalization: Anxiety Depression Medication stabilization Suicidal ideation  Estimated Length of Stay: 3-7 Days  Last 3 [place] Suicide Severity Risk Score: [place] Admission (Current) from 01/07/2023 in [place] [facility] ED from 01/06/2023 in [facility] ED from 09/15/2022 in [place] Low Risk No Risk No Risk       Last [facility] 2/9 Scores:    01/06/2023   11:13 PM  Depression screen PHQ 2/9  Decreased Interest 1  Down, Depressed, Hopeless 1  PHQ - 2 Score 2  Altered sleeping 1  Tired, decreased energy 1  Change in appetite 1  Feeling bad or failure about yourself  1  Trouble concentrating 1  Moving slowly or fidgety/restless 1  Suicidal thoughts 1  PHQ-9 Score 9  Difficult doing work/chores Somewhat difficult  detox, medication management for mood stabilization; elimination of SI thoughts; development of comprehensive mental wellness/sobriety plan  Scribe for Treatment Team: Windle Guard, LCSW 01/12/2023 1:57 PM

## 2023-01-12 NOTE — Group Note (Signed)
Date:  01/12/2023 Time:  4:22 PM  Group Topic/Focus:  Conflict Resolution:   The focus of this group is to discuss the conflict resolution process and how it may be used upon discharge.    Participation Level:  Did Not Attend  Participation Quality:      Affect:      Cognitive:      Insight: None  Engagement in Group:  None  Modes of Intervention:  Problem-solving  Additional Comments:     Jerrye Beavers 01/12/2023, 4:22 PM

## 2023-01-12 NOTE — Progress Notes (Addendum)
[facility] MD Progress Note  01/12/2023 3:31 PM Kristine Garcia  MRN:  [id number]  Subjective:     Kristine Garcia is a single 27 y/o African-American female with past psychiatric history of major depressive disorder, recurrent without psychosis, anxiety disorder, and medical history of vitamin D deficiency who presents voluntarily to [facility] from [facility] due to suicidal ideation.    On evaluation today, the patient reports that her mood is still somewhat depressed.  She reports back pain due to muscle spasm.  She reports sleep is better.  Denies any AH or paranoia, which is an improvement.  She is more interactive and less irritable today.  She reports that suicidal thoughts are less intense and less frequent, but still has these suicidal thoughts from time to time, without any intent or plan at this time.  Denies any HI.  Patient is very overwhelmed by the discussions are surrounding discharge planning.  Patient is agreeable to contact [facility], as this might be an option for the patient to provide secure safe and sober housing.  She reports sweating attributed to psychiatric medication and denies any other side effects.  She is agreeable to increasing fluoxetine.  She he is agreeable to starting Robaxin for muscle spasm.   Principal Problem: MDD (major depressive disorder), recurrent severe, without psychosis ([place]) Diagnosis: Principal Problem:   MDD (major depressive disorder), recurrent severe, without psychosis ([place]) Active Problems:   PTSD (post-traumatic stress disorder)   GAD (generalized anxiety disorder)  Total Time spent with patient: 15 minutes  Past Psychiatric History: Previous Psych Diagnoses: Yes, major depressive disorder Prior inpatient treatment: Patient denies Current/prior outpatient treatment: Patient denies Prior rehab hx: Denies Psychotherapy hx: Patient denies History of suicide: Yes, patient attempted to hang self with a rope in 03/07/03, when her  mother died.  Then in Mar 06, 2005 she attempted to overdose with a bunch of medication, nonspecific any history of blood History of homicide or aggression: Denies Psychiatric medication history: Patient denies Psychiatric medication compliance history: Patient has not started any psychotropic drugs Neuromodulation history: Denies Current Psychiatrist: Patient denies Current therapist: Denies  Past Medical History:  Past Medical History:  Diagnosis Date   Known health problems: none    Vitamin D deficiency     Past Surgical History:  Procedure Laterality Date   NO PAST SURGERIES     Family History:  Family History  Problem Relation Age of Onset   Hypertension Mother    Hypertension Father    Sickle cell trait Maternal Grandmother    Family Psychiatric  History: See H&P Social History:  Social History   Substance and Sexual Activity  Alcohol Use Not Currently   Comment: social     Social History   Substance and Sexual Activity  Drug Use Yes   Types: Marijuana   Comment: occ    Social History   Socioeconomic History   Marital status: Single    Spouse name: Not on file   Number of children: Not on file   Years of education: Not on file   Highest education level: Not on file  Occupational History   Not on file  Tobacco Use   Smoking status: Former    Types: Cigars   Smokeless tobacco: Never  Vaping Use   Vaping Use: Never used  Substance and Sexual Activity   Alcohol use: Not Currently    Comment: social   Drug use: Yes    Types: Marijuana    Comment: occ  Sexual activity: Not Currently    Birth control/protection: None  Other Topics Concern   Not on file  Social History Narrative   Not on file   Social Determinants of Health   Financial Resource Strain: Not on file  Food Insecurity: Not on file  Transportation Needs: Not on file  Physical Activity: Not on file  Stress: Not on file  Social Connections: Not on file   Additional Social History:                          Sleep: Fair  Appetite:  Fair  Current Medications: Current Facility-Administered Medications  Medication Dose Route Frequency Provider Last Rate Last Admin   acetaminophen (TYLENOL) tablet 650 mg  650 mg Oral Q6H PRN Revonda Humphrey, NP   650 mg at 01/08/23 2124   alum & mag hydroxide-simeth (MAALOX/MYLANTA) 200-200-20 MG/5ML suspension 30 mL  30 mL Oral Q4H PRN Revonda Humphrey, NP       benzocaine (ORAJEL) 10 % mucosal gel   Mouth/Throat QID PRN Jomarie Gellis, Ovid Curd, MD       haloperidol (HALDOL) tablet 5 mg  5 mg Oral TID PRN Janine Limbo, MD       And   LORazepam (ATIVAN) tablet 2 mg  2 mg Oral TID PRN Janine Limbo, MD       And   diphenhydrAMINE (BENADRYL) capsule 50 mg  50 mg Oral TID PRN Aryanah Enslow, Ovid Curd, MD       haloperidol lactate (HALDOL) injection 5 mg  5 mg Intramuscular TID PRN Ladene Allocca, Ovid Curd, MD       And   LORazepam (ATIVAN) injection 2 mg  2 mg Intramuscular TID PRN Charolette Bultman, Ovid Curd, MD       And   diphenhydrAMINE (BENADRYL) injection 50 mg  50 mg Intramuscular TID PRN Janine Limbo, MD       Derrill Memo ON 01/13/2023] FLUoxetine (PROZAC) capsule 40 mg  40 mg Oral Daily Valleri Hendricksen, MD       hydrOXYzine (ATARAX) tablet 25 mg  25 mg Oral TID PRN Revonda Humphrey, NP   25 mg at 01/10/23 2139   magnesium hydroxide (MILK OF MAGNESIA) suspension 30 mL  30 mL Oral Daily PRN Revonda Humphrey, NP       methocarbamol (ROBAXIN) tablet 500 mg  500 mg Oral Q6H PRN Danel Studzinski, Ovid Curd, MD       OLANZapine (ZYPREXA) tablet 5 mg  5 mg Oral QHS Thomes Lolling H, NP   5 mg at 01/11/23 2116   polyethylene glycol (MIRALAX / GLYCOLAX) packet 17 g  17 g Oral Q1H PRN Merrily Brittle, DO       sodium chloride (OCEAN) 0.65 % nasal spray 1 spray  1 spray Each Nare PRN Ambera Fedele, Ovid Curd, MD       traZODone (DESYREL) tablet 50 mg  50 mg Oral QHS PRN Revonda Humphrey, NP   50 mg at 01/11/23 2117    Lab Results: No results found for  this or any previous visit (from the past 6 hour(s)).  Blood Alcohol level:  Lab Results  Component Value Date   [facility] <10 01/06/2023   ETH <10 123XX123    Metabolic Disorder Labs: Lab Results  Component Value Date   HGBA1C 5.2 09/15/2022   MPG 102.54 09/15/2022   MPG 111.15 11/17/2021   Lab Results  Component Value Date   PROLACTIN 14.9 11/17/2021   Lab Results  Component Value Date  CHOL 192 09/15/2022   TRIG 58 09/15/2022   HDL 52 09/15/2022   CHOLHDL 3.7 09/15/2022   VLDL 12 09/15/2022   LDLCALC 128 (H) 09/15/2022   LDLCALC 109 (H) 11/17/2021    Physical Findings: AIMS: Facial and Oral Movements Muscles of Facial Expression: None, normal Lips and Perioral Area: None, normal Jaw: None, normal Tongue: None, normal,Extremity Movements Upper (arms, wrists, hands, fingers): None, normal Lower (legs, knees, ankles, toes): None, normal, Trunk Movements Neck, shoulders, hips: None, normal, Overall Severity Severity of abnormal movements (highest score from questions above): None, normal Incapacitation due to abnormal movements: None, normal Patient's awareness of abnormal movements (rate only patient's report): No Awareness, Dental Status Current problems with teeth and/or dentures?: No Does patient usually wear dentures?: No  CIWA:    COWS:     Musculoskeletal: Strength & Muscle Tone: within normal limits Gait & Station: normal Patient leans: N/A  No EPS  Psychiatric Specialty Exam:  Presentation  General Appearance:  Casual  Eye Contact: Good  Speech: Normal Rate  Speech Volume: Normal  Handedness: Right   Mood and Affect  Mood: Depressed  Affect: Congruent   Thought Process  Thought Processes: Linear  Descriptions of Associations:Intact  Orientation:Full (Time, Place and Person)  Thought Content:Logical  History of Schizophrenia/Schizoaffective disorder:No  Duration of Psychotic Symptoms:Less than six  months  Hallucinations:Hallucinations: None  Ideas of Reference:None  Suicidal Thoughts:Suicidal Thoughts: Yes, Passive SI Passive Intent and/or Plan: Without Intent; Without Plan  Homicidal Thoughts:Homicidal Thoughts: No   Sensorium  Memory: Immediate Good; Recent Good; Remote Good  Judgment: Fair  Insight: Fair   Materials engineer: Fair  Attention Span: Fair  Recall: Good  Fund of Knowledge: Good  Language: Good   Psychomotor Activity  Psychomotor Activity: Psychomotor Activity: Normal   Assets  Assets: Communication Skills; Desire for Improvement; Resilience   Sleep  Sleep: Sleep: Fair    Physical Exam: Physical Exam Vitals reviewed.  Constitutional:      General: She is not in acute distress.    Appearance: She is normal weight.  Neurological:     Mental Status: She is alert.     Motor: No weakness.     Gait: Gait normal.    Review of Systems  Psychiatric/Behavioral:  Positive for depression, substance abuse and suicidal ideas. The patient is nervous/anxious.    Blood pressure 102/75, pulse (!) 113, temperature 98.7 F (37.1 C), temperature source Oral, resp. rate 16, height 5' 8"$  (1.727 m), weight 112.5 kg, SpO2 100 %. Body mass index is 37.71 kg/m.   Treatment Plan Summary:  Daily contact with patient to assess and evaluate symptoms and progress in treatment and Medication management   ASSESSMENT:   Diagnoses / Active Problems: MDD with psychotic features versus schizoaffective disorder bipolar type GAD PTSD   PLAN: Safety and Monitoring:             --  Voluntary admission to inpatient psychiatric unit for safety, stabilization and treatment             -- Daily contact with patient to assess and evaluate symptoms and progress in treatment             -- Patient's case to be discussed in multi-disciplinary team meeting             -- Observation Level : q15 minute checks             -- Vital signs:   q12 hours             --  Precautions: suicide, elopement, and assault   2. Psychiatric Diagnoses and Treatment:               -Continue Zyprexa 5 mg nightly for psychotic symptoms and mood stability -Increase Prozac from 30 mg to 40 mg once daily for MDD, GAD, PTSD          -Patient declined to start metformin to mitigate any metabolic side effects of Zyprexa  -Start Robaxin as needed for muscle spasm   --  The risks/benefits/side-effects/alternatives to this medication were discussed in detail with the patient and time was given for questions. The patient consents to medication trial.                -- Metabolic profile and EKG monitoring obtained while on an atypical antipsychotic (BMI: Lipid Panel: HbgA1c: QTc:)              -- Encouraged patient to participate in unit milieu and in scheduled group therapies                           3. Medical Issues Being Addressed:    4. Discharge Planning:              -- Social work and case management to assist with discharge planning and identification of hospital follow-up needs prior to discharge             -- Estimated LOS: 4-5 days             -- Discharge Concerns: Need to establish a safety plan; Medication compliance and effectiveness             -- Discharge Goals: Return home with outpatient referrals for mental health follow-up including medication management/psychotherapy  Christoper Allegra, MD 01/12/2023, 3:31 PM  Total Time Spent in Direct Patient Care:  I personally spent 35 minutes on the unit in direct patient care. The direct patient care time included face-to-face time with the patient, reviewing the patient's chart, communicating with other professionals, and coordinating care. Greater than 50% of this time was spent in counseling or coordinating care with the patient regarding goals of hospitalization, psycho-education, and discharge planning needs.   Janine Limbo, MD Psychiatrist

## 2023-01-13 MED ORDER — OLANZAPINE 5 MG PO TABS
5.0000 mg | ORAL_TABLET | Freq: Two times a day (BID) | ORAL | Status: DC
  Administered 2023-01-13 – 2023-01-15 (×4): 5 mg via ORAL
  Filled 2023-01-13 (×8): qty 1

## 2023-01-13 NOTE — Progress Notes (Signed)
   01/13/23 0126  Psych Admission Type (Psych Patients Only)  Admission Status Voluntary  Psychosocial Assessment  Patient Complaints Anxiety  Eye Contact Fair  Facial Expression Animated  Affect Appropriate to circumstance  Speech Logical/coherent  Interaction Assertive  Motor Activity Other (Comment) (WDL)  Appearance/Hygiene Unremarkable  Behavior Characteristics Appropriate to situation  Mood Depressed  Thought Process  Coherency Circumstantial  Content WDL  Delusions None reported or observed  Perception WDL  Hallucination None reported or observed  Judgment Impaired  Confusion None  Danger to Self  Current suicidal ideation? Denies  Agreement Not to Harm Self Yes  Description of Agreement verbal  Danger to Others  Danger to Others None reported or observed

## 2023-01-13 NOTE — Progress Notes (Signed)
Adult Psychoeducational Group Note  Date:  01/13/2023 Time:  9:23 PM  Group Topic/Focus:  Wrap-Up Group:   The focus of this group is to help patients review their daily goal of treatment and discuss progress on daily workbooks.  Participation Level:  Active  Participation Quality:  Appropriate  Affect:  Appropriate  Cognitive:  Appropriate  Insight: Appropriate  Engagement in Group:  Engaged  Modes of Intervention:  Discussion  Additional Comments:  Chantel attend wrap up group   Lenice Llamas Long 01/13/2023, 9:23 PM

## 2023-01-13 NOTE — Progress Notes (Signed)
D: Pt denied SI/HI/AVH this morning. Pt rated her depression a 3/10, anxiety a 8/10, and feelings of hopelessness a 4/10. Patient reports that she slept poorly last night. Patient slept in this morning. Pt has been pleasant, calm, and cooperative throughout the shift.   A: RN provided support and encouragement to patient. Pt given scheduled medications as prescribed. Q15 min checks verified for safety.    R: Patient verbally contracts for safety. Patient compliant with medications and treatment plan. Patient is interacting well on the unit. Pt is safe on the unit.   01/13/23 1100  Psych Admission Type (Psych Patients Only)  Admission Status Voluntary  Psychosocial Assessment  Patient Complaints Anxiety;Depression  Eye Contact Fair  Facial Expression Flat  Affect Appropriate to circumstance  Speech Logical/coherent  Interaction Assertive  Motor Activity Other (Comment) (WDL)  Appearance/Hygiene Unremarkable  Behavior Characteristics Appropriate to situation;Cooperative  Mood Depressed  Thought Process  Coherency Circumstantial  Content WDL  Delusions None reported or observed  Perception WDL  Hallucination None reported or observed  Judgment Impaired  Confusion None  Danger to Self  Current suicidal ideation? Denies  Description of Suicide Plan No plan  Agreement Not to Harm Self Yes  Description of Agreement Pt verbally contracts for safety  Danger to Others  Danger to Others None reported or observed

## 2023-01-13 NOTE — [facility] Group Notes (Signed)
.  Psychoeducational Group Note    Date:  01/13/2023 Time:1300-1400    Purpose of Group: . The group focus' on teaching patients on how to identify their needs and their Life Skills:  A group where two lists are made. What people need and what are things that we do that are unhealthy. The lists are developed by the patients and it is explained that we often do the actions that are not healthy to get our list of needs met.  Goal:: to develop the coping skills needed to get their needs met  Participation Level:  did not attend Kristine Garcia A     

## 2023-01-13 NOTE — Group Note (Signed)
LCSW Group Therapy Note   Group Date: 01/13/2023 Start Time: 1400 End Time: 1415   Topic: Cognitive Distortions  Due to limited staffing,  group was not held. Patient was provided therapeutic worksheets and asked to meet with CSW as needed.    Vassie Moselle, LCSW 01/13/2023  2:25 PM
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
## 2023-01-13 NOTE — Group Note (Signed)
Date:  01/13/2023 Time:  10:34 AM  Group Topic/Focus:  Goals Group:   The focus of this group is to help patients establish daily goals to achieve during treatment and discuss how the patient can incorporate goal setting into their daily lives to aide in recovery. Orientation:   The focus of this group is to educate the patient on the purpose and policies of crisis stabilization and provide a format to answer questions about their admission.  The group details unit policies and expectations of patients while admitted.    Participation Level:  Active  Participation Quality:  Appropriate  Affect:  Appropriate  Cognitive:  Appropriate  Insight: Appropriate  Engagement in Group:  Engaged  Modes of Intervention:  Discussion  Additional Comments:  Pt wants to work on discharge planning.  Garvin Fila 01/13/2023, 10:34 AM

## 2023-01-13 NOTE — [facility] Group Notes (Signed)
Goals Group 01/13/23   Group Focus: affirmation, clarity of thought, and goals/reality orientation Treatment Modality:  Psychoeducation Interventions utilized were assignment, group exercise, and support Purpose: To be able to understand and verbalize the reason for their admission to the hospital. To understand that the medication helps with their chemical imbalance but they also need to work on their choices in life. To be challenged to develop a list of 30 positives about themselves. Also introduce the concept that "feelings" are not reality.  Participation Level:  Active  Participation Quality:  Appropriate  Affect:  Appropriate  Cognitive:  Appropriate  Insight:  Improving  Engagement in Group:  Engaged  Additional Comments:  Rates her energy at a 10/10. Was able to take part in the conversation in the group.  Paulino Rily

## 2023-01-14 LAB — URINALYSIS, COMPLETE (UACMP) WITH MICROSCOPIC
Bilirubin Urine: NEGATIVE
Glucose, UA: NEGATIVE mg/dL
Hgb urine dipstick: NEGATIVE
Ketones, ur: NEGATIVE mg/dL
Nitrite: NEGATIVE
Protein, ur: NEGATIVE mg/dL
Specific Gravity, Urine: 1.01 (ref 1.005–1.030)
pH: 6 (ref 5.0–8.0)

## 2023-01-14 NOTE — Progress Notes (Signed)
   01/13/23 2000  Psych Admission Type (Psych Patients Only)  Admission Status Voluntary  Psychosocial Assessment  Patient Complaints None  Eye Contact Fair  Facial Expression Animated  Affect Appropriate to circumstance  Speech Logical/coherent  Interaction Assertive  Motor Activity Slow  Appearance/Hygiene Unremarkable  Behavior Characteristics Cooperative;Appropriate to situation  Mood Pleasant  Thought Process  Coherency Circumstantial  Content WDL  Delusions None reported or observed  Perception WDL  Hallucination None reported or observed  Judgment Impaired  Confusion None  Danger to Self  Current suicidal ideation? Denies  Agreement Not to Harm Self Yes  Description of Agreement verbal  Danger to Others  Danger to Others None reported or observed

## 2023-01-14 NOTE — Progress Notes (Signed)
   01/14/23 2237  Psych Admission Type (Psych Patients Only)  Admission Status Voluntary  Psychosocial Assessment  Patient Complaints Anxiety  Eye Contact Fair  Facial Expression Animated  Affect Appropriate to circumstance  Speech Logical/coherent  Interaction Assertive  Motor Activity Other (Comment) (WDL)  Appearance/Hygiene Unremarkable  Behavior Characteristics Appropriate to situation  Mood Pleasant  Thought Process  Coherency Circumstantial  Content WDL  Delusions None reported or observed  Perception WDL  Hallucination None reported or observed  Judgment Impaired  Confusion None  Danger to Self  Current suicidal ideation? Denies  Agreement Not to Harm Self Yes  Description of Agreement verbal  Danger to Others  Danger to Others None reported or observed

## 2023-01-14 NOTE — Progress Notes (Signed)
D: Pt denied SI/HI/AVH this morning. Pt rated her depression a 8/10, anxiety a 5/10, and feelings of hopelessness a 2/10. Pt complained about UTI symptoms this morning, provider made aware. Urine sample collected per provider order and sent to lab. Pt approached RN concerned about talking with a Education officer, museum about housing and disability resources. Social worker made aware and met with patient to discuss. Patient has been interactive on the milieu throughout the day. Pt has been pleasant, calm, and cooperative throughout the shift.   A: RN provided support and encouragement to patient. Pt given scheduled medications as prescribed. Q15 min checks verified for safety.    R: Patient verbally contracts for safety. Patient compliant with medications and treatment plan. Patient is interacting well on the unit. Pt is safe on the unit.   01/14/23 1100  Psych Admission Type (Psych Patients Only)  Admission Status Voluntary  Psychosocial Assessment  Patient Complaints Other (Comment) (q 15 minute safety checks)  Eye Contact Fair  Facial Expression Animated  Affect Anxious  Speech Logical/coherent  Interaction Assertive  Motor Activity Other (Comment) (WDL)  Appearance/Hygiene Unremarkable  Behavior Characteristics Cooperative;Appropriate to situation  Mood Pleasant;Anxious  Thought Process  Coherency Circumstantial  Content WDL  Delusions None reported or observed  Perception WDL  Hallucination None reported or observed  Judgment Impaired  Confusion None  Danger to Self  Current suicidal ideation? Denies  Agreement Not to Harm Self Yes  Description of Agreement Pt verbally contracts for safety  Danger to Others  Danger to Others None reported or observed

## 2023-01-14 NOTE — Progress Notes (Signed)
[facility] MD Progress Note  01/14/2023 8:53 AM Kristine Kristine Garcia  MRN:  [id number]  Subjective:   HPI: Kristine Kristine Garcia is a single 27 y/o African-American female with past psychiatric history of Kristine Garcia depressive disorder, recurrent without psychosis, anxiety disorder, and medical history of vitamin D deficiency who presents voluntarily to [facility] from [facility] due to suicidal ideation.   24 hour chart review: Patient noted to intermittently attend groups and unit activities. Staff note patient being circumstantial with impaired judgement. Medication compliant. Fluoxetine increased to 40 mg today; continue to monitor activation. Staff report patient complaining of UTI symptoms; u/a ordered. Lipid panel, hbg A1c, cbc reordered. PRN Hydroxyzine 25 mg TID PRN anxiety, Trazodone 50 mg sleep.   Assessment: Patient observed in dayroom watching television. She was assessed in separate room where she presented alert and oriented. Calm and cooperative. Withdrawn. Mood remains slightly dysphoric, tearful at times when discussing family; affect brightens on approach. Remains engaged throughout assessment. She reports speaking with dad and plan to move back home with him in [place], [place]. Expressed some concerns about ability to return to work full time and maintain employment; provider discussed need to remain in treatment to fully stabilize then gradually return to work force in which she was accepting of.    Appears to have improved insight compared to day prior. Speech is not pressured, less tangential. No flight of ideas noted. She does ramble some, still laughs inappropriately at times. Reports feeling some hope. Some hyper-focus on returning to home that she feels [place] and [place]; however she was able to talk herself back up about 'needing to slow down to get life together'. Discussed homework from group that she feels has been helpful (writing  letter to father about needs).   Currently rates depression 8/10; 'I'm feeling hopeless, I'm just calm about it'. However states she does have 'more hope than I did yesterday'. Endorses feeling more calm than day before. Denies any active suicidal or homicidal ideations, auditory or visual hallucinations. She denies any medication side effects; no changes warranted at this time.   Principal Problem: MDD (Kristine Garcia depressive disorder), recurrent severe, without psychosis ([place]) Diagnosis: Principal Problem:   MDD (Kristine Garcia depressive disorder), recurrent severe, without psychosis ([place]) Active Problems:   PTSD (post-traumatic stress disorder)   GAD (generalized anxiety disorder)  Total Time spent with patient: 15 minutes  Past Psychiatric History: Previous Psych Diagnoses: Yes, Kristine Garcia depressive disorder Prior inpatient treatment: Patient denies Current/prior outpatient treatment: Patient denies Prior rehab hx: Denies Psychotherapy hx: Patient denies History of suicide: Yes, patient attempted to hang self with a rope in 23-Feb-2003, when her mother died.  Then in 02-22-2005 she attempted to overdose with a bunch of medication, nonspecific any history of blood History of homicide or aggression: Denies Psychiatric medication history: Patient denies Psychiatric medication compliance history: Patient has not started any psychotropic drugs Neuromodulation history: Denies Current Psychiatrist: Patient denies Current therapist: Denies  Past Medical History:  Past Medical History:  Diagnosis Date   Known health problems: none    Vitamin D deficiency     Past Surgical History:  Procedure Laterality Date   NO PAST SURGERIES     Family History:  Family History  Problem Relation Age of Onset   Hypertension Mother    Hypertension Father    Sickle cell trait Maternal Grandmother    Family Psychiatric  History:  father: alcoholism,  Extensive substance abuse 'both sides'  Social History:  Social History    Substance and Sexual Activity  Alcohol Use Not Currently   Comment: social     Social History   Substance and Sexual Activity  Drug Use Yes   Types: Marijuana   Comment: occ    Social History   Socioeconomic History   Marital status: Single    Spouse name: Not on file   Number of children: Not on file   Years of education: Not on file   Highest education level: Not on file  Occupational History   Not on file  Tobacco Use   Smoking status: Former    Types: Cigars   Smokeless tobacco: Never  Vaping Use   Vaping Use: Never used  Substance and Sexual Activity   Alcohol use: Not Currently    Comment: social   Drug use: Yes    Types: Marijuana    Comment: occ   Sexual activity: Not Currently    Birth control/protection: None  Other Topics Concern   Not on file  Social History Narrative   Not on file   Social Determinants of Health   Financial Resource Strain: Not on file  Food Insecurity: Not on file  Transportation Needs: Not on file  Physical Activity: Not on file  Stress: Not on file  Social Connections: Not on file   Additional Social History:   Sleep: Fair  Appetite:  Fair  Current Medications: Current Facility-Administered Medications  Medication Dose Route Frequency Provider Last Rate Last Admin   acetaminophen (TYLENOL) tablet 650 mg  650 mg Oral Q6H PRN Revonda Humphrey, NP   650 mg at 01/08/23 2124   alum & mag hydroxide-simeth (MAALOX/MYLANTA) 200-200-20 MG/5ML suspension 30 mL  30 mL Oral Q4H PRN Revonda Humphrey, NP       benzocaine (ORAJEL) 10 % mucosal gel   Mouth/Throat QID PRN Massengill, Ovid Curd, MD       haloperidol (HALDOL) tablet 5 mg  5 mg Oral TID PRN Massengill, Ovid Curd, MD       And   LORazepam (ATIVAN) tablet 2 mg  2 mg Oral TID PRN Massengill, Ovid Curd, MD       And   diphenhydrAMINE (BENADRYL) capsule 50 mg  50 mg Oral TID PRN Massengill, Ovid Curd, MD       haloperidol lactate (HALDOL) injection 5 mg  5 mg Intramuscular TID PRN  Massengill, Ovid Curd, MD       And   LORazepam (ATIVAN) injection 2 mg  2 mg Intramuscular TID PRN Massengill, Ovid Curd, MD       And   diphenhydrAMINE (BENADRYL) injection 50 mg  50 mg Intramuscular TID PRN Massengill, Ovid Curd, MD       FLUoxetine (PROZAC) capsule 40 mg  40 mg Oral Daily Massengill, Nathan, MD   40 mg at 01/14/23 0804   hydrOXYzine (ATARAX) tablet 25 mg  25 mg Oral TID PRN Revonda Humphrey, NP   25 mg at 01/13/23 2101   magnesium hydroxide (MILK OF MAGNESIA) suspension 30 mL  30 mL Oral Daily PRN Revonda Humphrey, NP       methocarbamol (ROBAXIN) tablet 500 mg  500 mg Oral Q6H PRN Massengill, Ovid Curd, MD   500 mg at 01/12/23 1649   OLANZapine (ZYPREXA) tablet 5 mg  5 mg Oral BID Leevy-Johnson, Swetha Rayle A, NP   5 mg at 01/14/23 0804   polyethylene glycol (MIRALAX / GLYCOLAX) packet 17 g  17 g Oral Q1H PRN Merrily Brittle, DO  sodium chloride (OCEAN) 0.65 % nasal spray 1 spray  1 spray Each Nare PRN Massengill, Ovid Curd, MD       traZODone (DESYREL) tablet 50 mg  50 mg Oral QHS PRN Revonda Humphrey, NP   50 mg at 01/13/23 2101   Lab Results: No results found for this or any previous visit (from the past 48 hour(s)).  Blood Alcohol level:  Lab Results  Component Value Date   ETH <10 01/06/2023   ETH <10 123XX123   Metabolic Disorder Labs: Lab Results  Component Value Date   HGBA1C 5.2 09/15/2022   MPG 102.54 09/15/2022   MPG 111.15 11/17/2021   Lab Results  Component Value Date   PROLACTIN 14.9 11/17/2021   Lab Results  Component Value Date   CHOL 192 09/15/2022   TRIG 58 09/15/2022   HDL 52 09/15/2022   CHOLHDL 3.7 09/15/2022   VLDL 12 09/15/2022   LDLCALC 128 (H) 09/15/2022   LDLCALC 109 (H) 11/17/2021   Physical Findings: AIMS: Facial and Oral Movements Muscles of Facial Expression: None, normal Lips and Perioral Area: None, normal Jaw: None, normal Tongue: None, normal,Extremity Movements Upper (arms, wrists, hands, fingers): None, normal Lower  (legs, knees, ankles, toes): None, normal, Trunk Movements Neck, shoulders, hips: None, normal, Overall Severity Severity of abnormal movements (highest score from questions above): None, normal Incapacitation due to abnormal movements: None, normal Patient's awareness of abnormal movements (rate only patient's report): No Awareness, Dental Status Current problems with teeth and/or dentures?: No Does patient usually wear dentures?: No  CIWA:    COWS:     Musculoskeletal: Strength & Muscle Tone: within normal limits Gait & Station: normal Patient leans: N/A  No EPS  Psychiatric Specialty Exam:  Presentation  General Appearance:  Casual  Eye Contact: Fair  Speech: Normal Rate  Speech Volume: Normal  Handedness: Right  Mood and Affect  Mood: Dysphoric  Affect: Congruent; Flat  Thought Process  Thought Processes: Linear  Descriptions of Associations:Intact  Orientation:Full (Time, Place and Person)  Thought Content:Logical  History of Schizophrenia/Schizoaffective disorder:No  Duration of Psychotic Symptoms:Less than six months  Hallucinations:Hallucinations: None  Ideas of Reference:None  Suicidal Thoughts:Suicidal Thoughts: Yes, Passive  Homicidal Thoughts:Homicidal Thoughts: No  Sensorium  Memory: Immediate Good; Recent Good  Judgment: Fair  Insight: Fair  Community education officer  Concentration: Fair  Attention Span: Fair  Recall: Good  Fund of Knowledge: Good  Language: Good  Psychomotor Activity  Psychomotor Activity: Psychomotor Activity: Normal  Assets  Assets: Communication Skills; Desire for Improvement; Physical Health; Resilience  Sleep  Sleep: Sleep: Fair  Physical Exam: Physical Exam Vitals reviewed.  Constitutional:      General: She is not in acute distress.    Appearance: She is obese.  HENT:     Head: Normocephalic.     Nose: Nose normal.  Eyes:     Pupils: Pupils are equal, round, and reactive to  light.  Cardiovascular:     Rate and Rhythm: Normal rate.     Pulses: Normal pulses.  Pulmonary:     Effort: Pulmonary effort is normal.  Abdominal:     Palpations: Abdomen is soft.  Musculoskeletal:        General: Normal range of motion.  Skin:    General: Skin is dry.  Neurological:     Mental Status: She is alert and oriented to person, place, and time.     Motor: No weakness.     Gait: Gait normal.  Psychiatric:  Attention and Perception: Attention and perception normal.        Mood and Affect: Mood is depressed. Affect is flat.        Speech: Speech normal.        Behavior: Behavior is cooperative.        Thought Content: Thought content does not include suicidal ideation. Thought content does not include suicidal plan.    Review of Systems  Psychiatric/Behavioral:  Positive for depression and substance abuse. Negative for suicidal ideas. The patient is not nervous/anxious.    Blood pressure (!) 107/56, pulse 99, temperature (!) 97.2 F (36.2 C), temperature source Oral, resp. rate 16, height 5' 8"$  (1.727 m), weight 112.5 kg, SpO2 100 %. Body mass index is 37.71 kg/m.  Treatment Plan Summary:  Daily contact with patient to assess and evaluate symptoms and progress in treatment and Medication management   ASSESSMENT:   Diagnoses / Active Problems: MDD with psychotic features versus schizoaffective disorder bipolar type GAD PTSD   PLAN: Safety and Monitoring:             --  Voluntary admission to inpatient psychiatric unit for safety, stabilization and treatment             -- Daily contact with patient to assess and evaluate symptoms and progress in treatment             -- Patient's case to be discussed in multi-disciplinary team meeting             -- Observation Level : q15 minute checks             -- Vital signs:  q12 hours             -- Precautions: suicide, elopement, and assault   2. Psychiatric Diagnoses and Treatment:               -  Increase:  - Zyprexa 5 mg BID for psychotic symptoms and mood stability  - Continue:  - Fluoxetine 40 mg once daily for MDD, GAD, PTSD  - Continue to monitor patient  On medication for  possible activation. R/o mania.           -Patient declined to start metformin to mitigate any metabolic side effects of Zyprexa  -Start:  - Robaxin as needed for muscle  spasm   --  The risks/benefits/side-effects/alternatives to this medication were discussed in detail with the patient and time was given for questions. The patient consents to medication trial.                -- Metabolic profile and EKG monitoring obtained while on an atypical antipsychotic (BMI: Lipid Panel: HbgA1c: QTc:)   -Labs ordered for 01/14/23: CMP, CBC, Lipid profile, hbga1c, urinalysis; reordered 01/14/23              -- Encouraged patient to participate in unit milieu and in scheduled group therapies                           3. Medical Issues Being Addressed:    4. Discharge Planning:              -- Social work and case management to assist with discharge planning and identification of hospital follow-up needs prior to discharge             -- Estimated LOS: 4-5 days             --  Discharge Concerns: Need to establish a safety plan; Medication compliance and effectiveness             -- Discharge Goals: Return home with outpatient referrals for mental health follow-up including medication management/psychotherapy  Inda Merlin, NP 01/14/2023, 8:53 AM  Patient ID: Kristine Kristine Garcia, female   DOB: 1996-10-04, 27 y.o.   MRN: [id number] Patient ID: Kristine Kristine Garcia, female   DOB: 09-16-1996, 27 y.o.   MRN: [id number]

## 2023-01-15 DIAGNOSIS — N390 Urinary tract infection, site not specified: Secondary | ICD-10-CM | POA: Insufficient documentation

## 2023-01-15 LAB — HEMOGLOBIN A1C
Hgb A1c MFr Bld: 5.1 % (ref 4.8–5.6)
Mean Plasma Glucose: 99.67 mg/dL

## 2023-01-15 LAB — LIPID PANEL
Cholesterol: 223 mg/dL — ABNORMAL HIGH (ref 0–200)
HDL: 52 mg/dL (ref >40)
LDL Cholesterol: 162 mg/dL — ABNORMAL HIGH (ref 0–99)
Total CHOL/HDL Ratio: 4.3 RATIO
Triglycerides: 45 mg/dL (ref <150)
VLDL: 9 mg/dL (ref 0–40)

## 2023-01-15 MED ORDER — NITROFURANTOIN MONOHYD MACRO 100 MG PO CAPS
100.0000 mg | ORAL_CAPSULE | Freq: Two times a day (BID) | ORAL | Status: DC
  Administered 2023-01-15 – 2023-01-16 (×3): 100 mg via ORAL
  Filled 2023-01-15 (×6): qty 1

## 2023-01-15 MED ORDER — OLANZAPINE 5 MG PO TABS
5.0000 mg | ORAL_TABLET | Freq: Two times a day (BID) | ORAL | Status: DC
  Administered 2023-01-15 – 2023-01-16 (×2): 5 mg via ORAL
  Filled 2023-01-15 (×4): qty 1

## 2023-01-15 NOTE — [facility] Group Notes (Signed)
Patient attended the [place] group.

## 2023-01-15 NOTE — Progress Notes (Signed)
[facility] MD Progress Note  01/15/2023 1:34 PM [name]  MRN:  [id number]  Subjective:   HPI: Kristine Garcia is a single 27 y/o African-American female with past psychiatric history of major depressive disorder, recurrent without psychosis, anxiety disorder, and medical history of vitamin D deficiency who presents voluntarily to [facility] from [facility] due to suicidal ideation.   24 hour chart review: DBP noted to be slightly low earlier today morning at 41, and HR elevated at 108. Pt's assigned RN notified to recheck V/ss. Pt is compliant with scheduled medications.  She required Trazodone 50 mg last night for insomnia & Hydroxyzine 25 mg last night for anxiety.  Patient has attended 1 unit group sessions in the past 24 hours.  As per reports, patient complains of UTI symptoms yesterday, a review of urinalysis today shows large leukocytes, with rare bacteria.  Urine is also hazy in appearance.  We will treat for UTI.  Patient assessment note, 01/15/2023: On assessment today, patient is tearful as she narrates the events of her childhood, she talks about not really having a childhood, not being able to date as a teenager, talks about being stuck in her teenage years, states that she will be going back to her father's home, which is a trigger for her because she spends her childhood taking care of her younger siblings and not really being a child.  She talks about the fact that she did not deal with her mother's death, states that her mother passed away when she was 58 years old and she had to resume the responsibilities of an adult at that age.  She presents with passive SI, states that she would be at peace if she were to pass away in her sleep, but denies any intent, or plan to self harm.  She denies HI/AVH, denies paranoia, and there is no evidence of delusional thinking.  Patient presents with poor insight and poor judgment, as she does not seem to appreciate the fact that she  currently has shelter at her father's house which she did not have prior to today.  She perseverates about the minor things such as taking care of the dogs at her father's house and doing other domestic chores at her father's house.  She also ruminates about wanting to be on disability, and talks about not wanting to work "a job that pays just little money".  Writer suggested that she gets a job at [employer], but she states that [employer] pays approximately $16 an hour which will not be enough to sustain her.  Writer then suggested that she looks for a job at [employer] since they seem to pay a little bit more, but patient is persistent on the fact that she wants to apply for disability income.  Writer educated patient to talk to her CSW to explore this option.  Patient reports that overall, her sleep has improved since hospitalization, and also reports that racing thoughts have also improved with current medications.  She reports that depressive symptoms are still high just because of the thoughts of going back to her father's home.  Patient reports good sleep quality last night, reports a good appetite, and denies any medication related side effects.  We are continuing all of the patient's medications as listed below, and changing  5 PM Zyprexa to 8 PM to minimize daytime sedation and help with sleep.  We are also starting Macrobid 100 mg twice daily for UTI x 7 days. Our plan is  to discharge her tomorrow 01/16/2023 to her father's house, pending all outpatient follow up appointments being completed.  Principal Problem: MDD (major depressive disorder), recurrent severe, without psychosis ([place]) Diagnosis: Principal Problem:   MDD (major depressive disorder), recurrent severe, without psychosis ([place]) Active Problems:   PTSD (post-traumatic stress disorder)   GAD (generalized anxiety disorder)  Total Time spent with patient: 15 minutes  Past Psychiatric History: Previous Psych Diagnoses: Yes, major depressive  disorder Prior inpatient treatment: Patient denies Current/prior outpatient treatment: Patient denies Prior rehab hx: Denies Psychotherapy hx: Patient denies History of suicide: Yes, patient attempted to hang self with a rope in Mar 13, 2003, when her mother died.  Then in 2005-03-12 she attempted to overdose with a bunch of medication, nonspecific any history of blood History of homicide or aggression: Denies Psychiatric medication history: Patient denies Psychiatric medication compliance history: Patient has not started any psychotropic drugs Neuromodulation history: Denies Current Psychiatrist: Patient denies Current therapist: Denies  Past Medical History:  Past Medical History:  Diagnosis Date   Known health problems: none    Vitamin D deficiency     Past Surgical History:  Procedure Laterality Date   NO PAST SURGERIES     Family History:  Family History  Problem Relation Age of Onset   Hypertension Mother    Hypertension Father    Sickle cell trait Maternal Grandmother    Family Psychiatric  History:  father: alcoholism,  Extensive substance abuse 'both sides'  Social History:  Social History   Substance and Sexual Activity  Alcohol Use Not Currently   Comment: social     Social History   Substance and Sexual Activity  Drug Use Yes   Types: Marijuana   Comment: occ    Social History   Socioeconomic History   Marital status: Single    Spouse name: Not on file   Number of children: Not on file   Years of education: Not on file   Highest education level: Not on file  Occupational History   Not on file  Tobacco Use   Smoking status: Former    Types: Cigars   Smokeless tobacco: Never  Vaping Use   Vaping Use: Never used  Substance and Sexual Activity   Alcohol use: Not Currently    Comment: social   Drug use: Yes    Types: Marijuana    Comment: occ   Sexual activity: Not Currently    Birth control/protection: None  Other Topics Concern   Not on file   Social History Narrative   Not on file   Social Determinants of Health   Financial Resource Strain: Not on file  Food Insecurity: Not on file  Transportation Needs: Not on file  Physical Activity: Not on file  Stress: Not on file  Social Connections: Not on file   Additional Social History:   Sleep: Fair  Appetite:  Fair  Current Medications: Current Facility-Administered Medications  Medication Dose Route Frequency Provider Last Rate Last Admin   acetaminophen (TYLENOL) tablet 650 mg  650 mg Oral Q6H PRN Revonda Humphrey, NP   650 mg at 01/08/23 March 13, 2123   alum & mag hydroxide-simeth (MAALOX/MYLANTA) 200-200-20 MG/5ML suspension 30 mL  30 mL Oral Q4H PRN Revonda Humphrey, NP       benzocaine (ORAJEL) 10 % mucosal gel   Mouth/Throat QID PRN Massengill, Nathan, MD       haloperidol (HALDOL) tablet 5 mg  5 mg Oral TID PRN Janine Limbo, MD  And   LORazepam (ATIVAN) tablet 2 mg  2 mg Oral TID PRN Janine Limbo, MD       And   diphenhydrAMINE (BENADRYL) capsule 50 mg  50 mg Oral TID PRN Massengill, Ovid Curd, MD       haloperidol lactate (HALDOL) injection 5 mg  5 mg Intramuscular TID PRN Massengill, Ovid Curd, MD       And   LORazepam (ATIVAN) injection 2 mg  2 mg Intramuscular TID PRN Janine Limbo, MD       And   diphenhydrAMINE (BENADRYL) injection 50 mg  50 mg Intramuscular TID PRN Massengill, Ovid Curd, MD       FLUoxetine (PROZAC) capsule 40 mg  40 mg Oral Daily Massengill, Ovid Curd, MD   40 mg at 01/15/23 [id number]   hydrOXYzine (ATARAX) tablet 25 mg  25 mg Oral TID PRN Revonda Humphrey, NP   25 mg at 01/14/23 2107   magnesium hydroxide (MILK OF MAGNESIA) suspension 30 mL  30 mL Oral Daily PRN Revonda Humphrey, NP       methocarbamol (ROBAXIN) tablet 500 mg  500 mg Oral Q6H PRN Janine Limbo, MD   500 mg at 01/12/23 1649   nitrofurantoin (macrocrystal-monohydrate) (MACROBID) capsule 100 mg  100 mg Oral Q12H Avien Taha, NP       OLANZapine (ZYPREXA) tablet  5 mg  5 mg Oral BID Nicholes Rough, NP       polyethylene glycol (MIRALAX / GLYCOLAX) packet 17 g  17 g Oral Q1H PRN Merrily Brittle, DO       sodium chloride (OCEAN) 0.65 % nasal spray 1 spray  1 spray Each Nare PRN Massengill, Ovid Curd, MD       traZODone (DESYREL) tablet 50 mg  50 mg Oral QHS PRN Revonda Humphrey, NP   50 mg at 01/14/23 2107   Lab Results:  Results for orders placed or performed during the hospital encounter of 01/07/23 (from the past 48 hour(s))  Urinalysis, Complete w Microscopic -Urine, Clean Catch     Status: Abnormal   Collection Time: 01/14/23  8:53 AM  Result Value Ref Range   Color, Urine YELLOW YELLOW   APPearance HAZY (A) CLEAR   Specific Gravity, Urine 1.010 1.005 - 1.030   pH 6.0 5.0 - 8.0   Glucose, UA NEGATIVE NEGATIVE mg/dL   Hgb urine dipstick NEGATIVE NEGATIVE   Bilirubin Urine NEGATIVE NEGATIVE   Ketones, ur NEGATIVE NEGATIVE mg/dL   Protein, ur NEGATIVE NEGATIVE mg/dL   Nitrite NEGATIVE NEGATIVE   Leukocytes,Ua LARGE (A) NEGATIVE   RBC / HPF 21-50 0 - 5 RBC/hpf   WBC, UA 11-20 0 - 5 WBC/hpf   Bacteria, UA RARE (A) NONE SEEN   Squamous Epithelial / HPF 6-10 0 - 5 /HPF    Comment: Performed at [facility], [place] [address], [place], [place] [address]  Hemoglobin A1c     Status: None   Collection Time: 01/15/23  6:26 AM  Result Value Ref Range   Hgb A1c MFr Bld 5.1 4.8 - 5.6 %    Comment: (NOTE) Pre diabetes:          5.7%-6.4%  Diabetes:              >6.4%  Glycemic control for   <7.0% adults with diabetes    Mean Plasma Glucose 99.67 mg/dL    Comment: Performed at [facility] [address], [place], [place] [address]  Lipid panel  Status: Abnormal   Collection Time: 01/15/23  6:26 AM  Result Value Ref Range   Cholesterol 223 (H) 0 - 200 mg/dL   Triglycerides 45 <150 mg/dL   HDL 52 >40 mg/dL   Total CHOL/HDL Ratio 4.3 RATIO   VLDL 9 0 - 40 mg/dL   LDL Cholesterol 162 (H) 0 - 99 mg/dL    Comment:         Total Cholesterol/HDL:CHD Risk Coronary Heart Disease Risk Table                     Men   Women  1/2 Average Risk   3.4   3.3  Average Risk       5.0   4.4  2 X Average Risk   9.6   7.1  3 X Average Risk  23.4   11.0        Use the calculated Patient Ratio above and the CHD Risk Table to determine the patient's CHD Risk.        ATP III CLASSIFICATION (LDL):  <100     mg/dL   Optimal  100-129  mg/dL   Near or Above                    Optimal  130-159  mg/dL   Borderline  160-189  mg/dL   High  >190     mg/dL   Very High Performed at [facility] [address] [place], [place] [address]     Blood Alcohol level:  Lab Results  Component Value Date   [facility] <10 01/06/2023   ETH <10 [id number]   Metabolic Disorder Labs: Lab Results  Component Value Date   HGBA1C 5.1 01/15/2023   MPG 99.67 01/15/2023   MPG 102.54 09/15/2022   Lab Results  Component Value Date   PROLACTIN 14.9 11/17/2021   Lab Results  Component Value Date   CHOL 223 (H) 01/15/2023   TRIG 45 01/15/2023   HDL 52 01/15/2023   CHOLHDL 4.3 01/15/2023   VLDL 9 01/15/2023   LDLCALC 162 (H) 01/15/2023   LDLCALC 128 (H) 09/15/2022   Physical Findings: AIMS: Facial and Oral Movements Muscles of Facial Expression: None, normal Lips and Perioral Area: None, normal Jaw: None, normal Tongue: None, normal,Extremity Movements Upper (arms, wrists, hands, fingers): None, normal Lower (legs, knees, ankles, toes): None, normal, Trunk Movements Neck, shoulders, hips: None, normal, Overall Severity Severity of abnormal movements (highest score from questions above): None, normal Incapacitation due to abnormal movements: None, normal Patient's awareness of abnormal movements (rate only patient's report): No Awareness, Dental Status Current problems with teeth and/or dentures?: No Does patient usually wear dentures?: No  CIWA:    COWS:     Musculoskeletal: Strength & Muscle Tone: within  normal limits Gait & Station: normal Patient leans: N/A  No EPS  Psychiatric Specialty Exam:  Presentation  General Appearance:  Fairly Groomed  Eye Contact: Fair  Speech: Clear and Coherent  Speech Volume: Normal  Handedness: Right  Mood and Affect  Mood: Anxious; Depressed  Affect: Congruent  Thought Process  Thought Processes: Coherent  Descriptions of Associations:Intact  Orientation:Full (Time, Place and Person)  Thought Content:Rumination; Perseveration  History of Schizophrenia/Schizoaffective disorder:No  Duration of Psychotic Symptoms:N/A  Hallucinations:Hallucinations: None  Ideas of Reference:None  Suicidal Thoughts:Suicidal Thoughts: Yes, Passive SI Passive Intent and/or Plan: Without Intent; Without Plan  Homicidal Thoughts:Homicidal Thoughts: No  Sensorium  Memory: Immediate Good  Judgment: Poor  Insight: Poor  Community education officer  Concentration: Fair  Attention Span: Fair  Recall: Good  Fund of Knowledge: Good  Language: Good  Psychomotor Activity  Psychomotor Activity: Psychomotor Activity: Normal  Assets  Assets: Communication Skills; Social Support  Sleep  Sleep: Sleep: Good  Physical Exam: Physical Exam Vitals reviewed.  Constitutional:      General: She is not in acute distress.    Appearance: She is obese.  HENT:     Head: Normocephalic.     Nose: Nose normal.  Eyes:     Pupils: Pupils are equal, round, and reactive to light.  Cardiovascular:     Rate and Rhythm: Normal rate.     Pulses: Normal pulses.  Pulmonary:     Effort: Pulmonary effort is normal.  Abdominal:     Palpations: Abdomen is soft.  Musculoskeletal:        General: Normal range of motion.  Skin:    General: Skin is dry.  Neurological:     Mental Status: She is alert and oriented to person, place, and time.     Motor: No weakness.     Gait: Gait normal.  Psychiatric:        Attention and Perception: Attention and  perception normal.        Mood and Affect: Mood is depressed. Affect is flat.        Speech: Speech normal.        Behavior: Behavior is cooperative.        Thought Content: Thought content does not include suicidal ideation. Thought content does not include suicidal plan.    Review of Systems  Constitutional: Negative.   Respiratory: Negative.    Cardiovascular: Negative.   Genitourinary: Negative.   Musculoskeletal: Negative.   Skin: Negative.   Neurological: Negative.   Psychiatric/Behavioral:  Positive for depression, substance abuse and suicidal ideas (passsive SI, no plan/intent). Negative for hallucinations and memory loss. The patient has insomnia. The patient is not nervous/anxious.    Blood pressure (!) 114/41, pulse (!) 108, temperature (!) 97.4 F (36.3 C), temperature source Oral, resp. rate 16, height 5' 8"$  (1.727 m), weight 112.5 kg, SpO2 100 %. Body mass index is 37.71 kg/m.  Treatment Plan Summary:  Daily contact with patient to assess and evaluate symptoms and progress in treatment and Medication management   Diagnoses  Principal Problem:   MDD (major depressive disorder), recurrent severe, without psychosis ([place]) Active Problems:   PTSD (post-traumatic stress disorder)   GAD (generalized anxiety disorder)   PLAN: Safety and Monitoring:             --  Voluntary admission to inpatient psychiatric unit for safety, stabilization and treatment             -- Daily contact with patient to assess and evaluate symptoms and progress in treatment             -- Patient's case to be discussed in multi-disciplinary team meeting             -- Observation Level : q15 minute checks             -- Vital signs:  q12 hours             -- Precautions: Safety   Medications  -Continue Zyprexa 5 mg BID for psychotic symptoms and mood stabilization -Continue Fluoxetine 40 mg once daily for MDD, GAD, PTSD -Start Macrobid 100 mg BID for UTI x 7 days  Other PRNS -  Continue  Tylenol 650 mg every 6 hours PRN for mild pain -Continue Maalox 30 mg every 4 hrs PRN for indigestion -Continue Imodium 2-4 mg as needed for diarrhea -Continue Milk of Magnesia as needed every 6 hrs for constipation -Continue Zofran disintegrating tabs every 6 hrs PRN for nausea   Long Term Goal(s): Improvement in symptoms so as ready for discharge  Short Term Goals: Ability to identify changes in lifestyle to reduce recurrence of condition will improve, Ability to verbalize feelings will improve, Ability to disclose and discuss suicidal ideas, Ability to demonstrate self-control will improve, Ability to identify and develop effective coping behaviors will improve, Ability to maintain clinical measurements within normal limits will improve, Compliance with prescribed medications will improve, and Ability to identify triggers associated with substance abuse/mental health issues will improve  Discharge Planning: Social work and case management to assist with discharge planning and identification of hospital follow-up needs prior to discharge Estimated LOS: 5-7 days Discharge Concerns: Need to establish a safety plan; Medication compliance and effectiveness Discharge Goals: Return home with outpatient referrals for mental health follow-up including medication management/psychotherapy  I certify that inpatient services furnished can reasonably be expected to improve the patient's condition.    Nicholes Rough, NP 2/12/20241:29 PM

## 2023-01-15 NOTE — Plan of Care (Signed)
Nurse discussed anxiety, depression and coping skills with patient.  

## 2023-01-15 NOTE — Group Note (Signed)
Recreation Therapy Group Note   Group Topic:Health and Wellness  Group Date: 01/15/2023 Start Time: 0945 End Time: 1000 Facilitators: Carla Whilden-McCall, LRT,CTRS Location: [address] Dayroom   Goal Area(s) Addresses:  Patient will define components of whole wellness. Patient will verbalize benefit of whole wellness.   Group Description: Exercise.  LRT and patients discussed the importance of wellness and exercise.  LRT then instructed patients they would lead the group in whatever stretches or exercises of their choosing.  Patients were to make the most of the activity and give themselves the opportunity to get the most the out of the activity.   Affect/Mood: N/A   Participation Level: Did not attend    Clinical Observations/Individualized Feedback:     Plan: Continue to engage patient in RT group sessions 2-3x/week.   Ceri Mayer-McCall, LRT,CTRS  01/15/2023 11:51 AM

## 2023-01-15 NOTE — Progress Notes (Signed)
Patient denies SI, HI, AVH, & pain. Patient rated her anxiety a 8/10 and depression 10/10. Patient stated mood is "numbed"  I am getting ready to get back to the reality, but my dad said it is okay for me to come back home".  Writer encouraged patient to use her coping skills. Patient verbally contracts for safety. Emotional support and encouragement provided. PRN Hydroxyzine for anxiety and Trazodone for sleep administered per [facility], patient tolerates medications well, no adverse drug effects reported. Patient kept safe via 15 minutes checks, no s/s of distress noted, no physical symptoms reported.

## 2023-01-15 NOTE — Progress Notes (Signed)
Adult Psychoeducational Group Note  Date:  01/15/2023 Time:  9:58 AM  Group Topic/Focus:  Goals Group:   The focus of this group is to help patients establish daily goals to achieve during treatment and discuss how the patient can incorporate goal setting into their daily lives to aide in recovery.  Participation Level:  Did Not Attend  Participation Quality:   n/a  Affect:   n/a  Cognitive:   n/a  Insight: None  Engagement in Group:   n/a  Modes of Intervention:   n/a  Additional Comments:   Did not attend  Barbaraann Share 01/15/2023, 9:58 AM

## 2023-01-15 NOTE — Progress Notes (Signed)
D:  Patient's self inventory sheet, patient has fair sleep, medication helpful.  Fair appetite, normal energy level, good concentration.  Rated depression, anxiety, hopeless 6.  Withdrawals, irritability.  Denied SI.  Physical problems denied.  Left foot, bottom L foot, pain #3.  Goal is attend groups, talk to SW today.  Does have discharge plans. A:  Medications administered per MD orders.  Emotional support and encouragement given patient. R:  Denied SI and HI, contracts for safety.  Denied A/V hallucinations.  Safety maintained with 15 minute checks.

## 2023-01-16 MED ORDER — HYDROXYZINE HCL 25 MG PO TABS: 25.0000 mg | ORAL_TABLET | Freq: Three times a day (TID) | ORAL | 0 refills | Status: DC | PRN

## 2023-01-16 MED ORDER — NITROFURANTOIN MONOHYD MACRO 100 MG PO CAPS: 100.0000 mg | ORAL_CAPSULE | Freq: Two times a day (BID) | ORAL | 0 refills | Status: DC

## 2023-01-16 MED ORDER — FLUOXETINE HCL 40 MG PO CAPS: 40.0000 mg | ORAL_CAPSULE | Freq: Every day | ORAL | 0 refills | Status: DC

## 2023-01-16 MED ORDER — OLANZAPINE 5 MG PO TABS: 5.0000 mg | ORAL_TABLET | Freq: Two times a day (BID) | ORAL | 0 refills | Status: DC

## 2023-01-16 MED ORDER — ONDANSETRON HCL 4 MG PO TABS: 4.0000 mg | ORAL_TABLET | Freq: Three times a day (TID) | ORAL | Status: DC | PRN

## 2023-01-16 MED ORDER — TRAZODONE HCL 50 MG PO TABS: 50.0000 mg | ORAL_TABLET | Freq: Every evening | ORAL | 0 refills | Status: DC | PRN

## 2023-01-16 NOTE — Progress Notes (Signed)
   01/16/23 0532  15 Minute Checks  Location Bedroom  Visual Appearance Calm  Behavior Sleeping  Sleep (Behavioral Health Patients Only)  Calculate sleep? (Click Yes once per 24 hr at 0600 safety check) Yes  Documented sleep last 24 hours 7.75

## 2023-01-16 NOTE — Progress Notes (Signed)
Patient discharged via [facility] to go to the [facility]. Patient given prescriptions, AVS, and follow up instructions. Patient denies SI/HI/AVH on discharge.

## 2023-01-16 NOTE — [facility] Suicide Risk Assessment (Cosign Needed Addendum)
Suicide Risk Assessment  Discharge Assessment    [facility] Discharge Suicide Risk Assessment   Principal Problem: MDD (major depressive disorder), recurrent severe, without psychosis ([place]) Discharge Diagnoses: Principal Problem:   MDD (major depressive disorder), recurrent severe, without psychosis ([place]) Active Problems:   PTSD (post-traumatic stress disorder)   GAD (generalized anxiety disorder)   UTI (urinary tract infection)  HPI: Kristine Garcia is a single 27 y/o African-American female with past mental health history of MDD and GAD who presented to the [facility] health care center on 02/03 with complaints of passive suicidal ideations & worsening depressive symptoms in the context of socioeconomic stressors. Pt was transferred voluntarily to this [facility] for treatment and stabilization of her mental status.                                     HOSPITAL COURSE During the patient's hospitalization, patient had extensive initial psychiatric evaluation, and follow-up psychiatric evaluations every day. Psychiatric diagnoses provided upon initial assessment are as listed above. Patient's psychiatric medications were adjusted on admission as follows: --Initiated Zyprexa tablet 5 mg p.o. daily QHS for psychosis --Initiated Prozac 20 mg p.o. daily for depression  -Initiated hydroxyzine 25 mg 3 times daily as needed/anxiety -Initiated trazodone 50 mg 1 p.o. daily for insomnia PRN  During the hospitalization, other adjustments were made to the patient's psychiatric medication regimen. Medications at discharge are as follows: -Continue Zyprexa 5 mg BID for psychotic symptoms and mood stabilization -Continue Fluoxetine 40 mg once daily for MDD, GAD, PTSD -Continue Macrobid 100 mg BID for UTI x 6 more days, then follow up with PCP if symptoms persist -Continue Trazodone 50 mg nightly as needed for sleep -Continue Hydroxyzine 25 mg as needed for anxiety  Patient's care was discussed  during the interdisciplinary team meeting every day during the hospitalization. The patient denies having side effects to prescribed psychiatric medication. Gradually, patient started adjusting to milieu. The patient was evaluated each day by a clinical provider to ascertain response to treatment. Improvement was noted by the patient's report of decreasing symptoms, improved sleep and appetite, affect, medication tolerance, behavior, and participation in unit programming.  Patient was asked each day to complete a self inventory noting mood, mental status, pain, new symptoms, anxiety and concerns.    Symptoms were reported as significantly decreased or resolved completely by discharge. On day of discharge, the patient reports that their mood is stable. The patient denies having suicidal thoughts prior to discharge.  Patient denies having homicidal thoughts.  Patient denies having auditory hallucinations.  Patient denies any visual hallucinations or other symptoms of psychosis. The patient was motivated to continue taking medication with a goal of continued improvement in mental health.   The patient reports their target psychiatric symptoms of depression, anxiety and insomnia responded well to the psychiatric medications, and the patient reports overall benefit from this psychiatric hospitalization. Supportive psychotherapy was provided to the patient. The patient also participated in regular group therapy while hospitalized. Coping skills, problem solving as well as relaxation therapies were also part of the unit programming.  Labs were reviewed with the patient, and abnormal results were discussed with the patient. Lipid panel with cholesterol 223 & LDL 162. Pt educated on healthy food choices and on exercise, and the need to f/u with PCP. HA1C WNL @ 5.1. UA with large leukocytes and rare bacteria, pt symptomatic for UTI, being treated with  macrobid. CMP for this admission WNL. UDS +THC, educated on substance  use cessation and on the negative impact on her mental health. CBC WNL. Last TSH was on 09/15/22 and was WNL. EKG with QTC of 428.  The patient is able to verbalize their individual safety plan to this provider.  # It is recommended to the patient to continue psychiatric medications as prescribed, after discharge from the hospital.    # It is recommended to the patient to follow up with your outpatient psychiatric provider and PCP.  # It was discussed with the patient, the impact of alcohol, drugs, tobacco have been there overall psychiatric and medical wellbeing, and total abstinence from substance use was recommended the patient.ed.  # Prescriptions provided or sent directly to preferred pharmacy at discharge. Patient agreeable to plan. Given opportunity to ask questions. Appears to feel comfortable with discharge.    # In the event of worsening symptoms, the patient is instructed to call the crisis hotline (988), 911 and or go to the nearest ED for appropriate evaluation and treatment of symptoms. To follow-up with primary care provider for other medical issues, concerns and or health care needs  # Patient was discharged home to her father's house with a plan to follow up as noted below.   Total Time spent with patient: 45 minutes  Musculoskeletal: Strength & Muscle Tone: within normal limits Gait & Station: normal Patient leans: N/A  Psychiatric Specialty Exam  Presentation  General Appearance:  Appropriate for Environment; Fairly Groomed  Eye Contact: Good  Speech: Clear and Coherent  Speech Volume: Normal  Handedness: Right   Mood and Affect  Mood: Euthymic  Duration of Depression Symptoms: Greater than two weeks  Affect: Congruent   Thought Process  Thought Processes: Coherent  Descriptions of Associations:Intact  Orientation:Full (Time, Place and Person)  Thought Content:Logical  History of Schizophrenia/Schizoaffective disorder:No  Duration of  Psychotic Symptoms:N/A  Hallucinations:Hallucinations: None  Ideas of Reference:None  Suicidal Thoughts:Suicidal Thoughts: No SI Passive Intent and/or Plan: Without Intent; Without Plan  Homicidal Thoughts:Homicidal Thoughts: No   Sensorium  Memory: Immediate Good; Recent Good; Remote Good  Judgment: Fair  Insight: Fair   Community education officer  Concentration: Good  Attention Span: Good  Recall: Good  Fund of Knowledge: Good  Language: Good   Psychomotor Activity  Psychomotor Activity: Psychomotor Activity: Normal   Assets  Assets: Communication Skills   Sleep  Sleep: Sleep: Good   Physical Exam: Physical Exam Constitutional:      Appearance: Normal appearance.  HENT:     Head: Normocephalic.     Nose: Nose normal.  Eyes:     Pupils: Pupils are equal, round, and reactive to light.  Pulmonary:     Effort: Pulmonary effort is normal.  Musculoskeletal:        General: Normal range of motion.     Cervical back: Normal range of motion.  Neurological:     Mental Status: She is alert and oriented to person, place, and time.    Review of Systems  Constitutional: Negative.  Negative for fever.  HENT: Negative.  Negative for sore throat.   Eyes: Negative.  Negative for blurred vision.  Respiratory: Negative.  Negative for cough.   Cardiovascular: Negative.  Negative for chest pain.  Gastrointestinal: Negative.  Negative for heartburn.  Genitourinary: Negative.   Musculoskeletal: Negative.  Negative for myalgias.  Skin: Negative.   Psychiatric/Behavioral:  Positive for depression (Denies SI, denies HI, denies AVH, verbally contracts for safety outside  of this [facility]). Negative for hallucinations, memory loss, substance abuse and suicidal ideas. The patient is nervous/anxious (Significantly reduced and resolving on current meds) and has insomnia (resolving on current meds).    Blood pressure 117/84, pulse 78, temperature 98.1 F (36.7 C),  temperature source Oral, resp. rate 16, height 5' 8"$  (1.727 m), weight 112.5 kg, SpO2 100 %. Body mass index is 37.71 kg/m.  Mental Status Per Nursing Assessment::   On Admission:  Suicidal ideation indicated by patient  Demographic Factors:  Low socioeconomic status and Unemployed  Loss Factors: Financial problems/change in socioeconomic status  Historical Factors: Family history of mental illness or substance abuse  Risk Reduction Factors:   Living with another person, especially a relative and Positive social support  Continued Clinical Symptoms:  Patient reports that her depressive symptoms have significantly decreased since hospitalization. She denies SI, denies HI, denies AVH and verbally contracts for safety outside of this [facility].  Cognitive Features That Contribute To Risk:  None    Suicide Risk:  Mild:  There are no identifiable suicide plans, no associated intent, mild dysphoria and related symptoms, good self-control (both objective and subjective assessment), few other risk factors, and identifiable protective factors, including available and accessible social support.    [place]. Go to.   Specialty: Behavioral Health Why: Please go to this provider for an assessment, to obtain therapy and medication management services, on Monday through Friday, arrive by 7:20 am. Assessments are provided on a first come, first served basis. Contact information: Cle[place]ne Garland, NP 01/16/2023, 9:19 AM

## 2023-01-16 NOTE — Progress Notes (Signed)
Patient reports feeling sick, vomiting x one, no bowel movement in 72 hours. PRN Milk of Magnesia suspension administered per MAR, ginger ale given, tolerates well. Will contact provider for possible antiemetics.

## 2023-01-16 NOTE — Discharge Instructions (Signed)
-  Follow-up with your outpatient psychiatric provider -instructions on appointment date, time, and address (location) are provided to you in discharge paperwork.  -Take your psychiatric medications as prescribed at discharge - instructions are provided to you in the discharge paperwork  -Follow-up with outpatient primary care doctor and other specialists -for management of preventative medicine and any chronic medical disease.  -Recommend abstinence from alcohol, tobacco, and other illicit drug use at discharge.   -If your psychiatric symptoms recur, worsen, or if you have side effects to your psychiatric medications, call your outpatient psychiatric provider, 911, [phone] or go to the nearest emergency department.  -If suicidal thoughts occur, call your outpatient psychiatric provider, 911, 988 or go to the nearest emergency department.  Naloxone (Narcan) can help reverse an overdose when given to the victim quickly.  [place] offers free naloxone kits and instructions/training on its use.  Add naloxone to your first aid kit and you can help save a life.   Pick up your free kit at the following locations:   [place]:  [facility], [address] [place] [address] ([phone]) [facility] [address] [place] [id number] ([phone]) [facility] [address] [place] [address]  [place]: [facility] [address] [place] [address] ([phone]) [facility] [address] [place] [address] ([phone])  

## 2023-01-16 NOTE — Plan of Care (Signed)
Problem: Education: Goal: Knowledge of [place] [facility] Education information/materials will improve Outcome: Adequate for Discharge Goal: Emotional status will improve Outcome: Adequate for Discharge Goal: Mental status will improve Outcome: Adequate for Discharge Goal: Verbalization of understanding the information provided will improve Outcome: Adequate for Discharge   Problem: Activity: Goal: Interest or engagement in activities will improve Outcome: Adequate for Discharge Goal: Sleeping patterns will improve Outcome: Adequate for Discharge   Problem: Coping: Goal: Ability to verbalize frustrations and anger appropriately will improve Outcome: Adequate for Discharge Goal: Ability to demonstrate self-control will improve Outcome: Adequate for Discharge   Problem: Health Behavior/Discharge Planning: Goal: Identification of resources available to assist in meeting health care needs will improve Outcome: Adequate for Discharge Goal: Compliance with treatment plan for underlying cause of condition will improve Outcome: Adequate for Discharge   Problem: Physical Regulation: Goal: Ability to maintain clinical measurements within normal limits will improve Outcome: Adequate for Discharge   Problem: Safety: Goal: Periods of time without injury will increase Outcome: Adequate for Discharge   Problem: Education: Goal: Ability to make informed decisions regarding treatment will improve Outcome: Adequate for Discharge   Problem: Coping: Goal: Coping ability will improve Outcome: Adequate for Discharge   Problem: Health Behavior/Discharge Planning: Goal: Identification of resources available to assist in meeting health care needs will improve Outcome: Adequate for Discharge   Problem: Medication: Goal: Compliance with prescribed medication regimen will improve Outcome: Adequate for Discharge   Problem: Self-Concept: Goal: Ability to disclose and discuss suicidal ideas  will improve Outcome: Adequate for Discharge Goal: Will verbalize positive feelings about self Outcome: Adequate for Discharge   Problem: Education: Goal: Utilization of techniques to improve thought processes will improve Outcome: Adequate for Discharge Goal: Knowledge of the prescribed therapeutic regimen will improve Outcome: Adequate for Discharge   Problem: Activity: Goal: Interest or engagement in leisure activities will improve Outcome: Adequate for Discharge Goal: Imbalance in normal sleep/wake cycle will improve Outcome: Adequate for Discharge   Problem: Coping: Goal: Coping ability will improve Outcome: Adequate for Discharge Goal: Will verbalize feelings Outcome: Adequate for Discharge   Problem: Health Behavior/Discharge Planning: Goal: Ability to make decisions will improve Outcome: Adequate for Discharge Goal: Compliance with therapeutic regimen will improve Outcome: Adequate for Discharge   Problem: Role Relationship: Goal: Will demonstrate positive changes in social behaviors and relationships Outcome: Adequate for Discharge   Problem: Safety: Goal: Ability to disclose and discuss suicidal ideas will improve Outcome: Adequate for Discharge Goal: Ability to identify and utilize support systems that promote safety will improve Outcome: Adequate for Discharge   Problem: Self-Concept: Goal: Will verbalize positive feelings about self Outcome: Adequate for Discharge Goal: Level of anxiety will decrease Outcome: Adequate for Discharge   Problem: Education: Goal: Ability to state activities that reduce stress will improve Outcome: Adequate for Discharge   Problem: Coping: Goal: Ability to identify and develop effective coping behavior will improve Outcome: Adequate for Discharge   Problem: Self-Concept: Goal: Ability to identify factors that promote anxiety will improve Outcome: Adequate for Discharge Goal: Level of anxiety will decrease Outcome:  Adequate for Discharge Goal: Ability to modify response to factors that promote anxiety will improve Outcome: Adequate for Discharge   Problem: Activity: Goal: Will verbalize the importance of balancing activity with adequate rest periods Outcome: Adequate for Discharge   Problem: Education: Goal: Will be free of psychotic symptoms Outcome: Adequate for Discharge Goal: Knowledge of the prescribed therapeutic regimen will improve Outcome: Adequate for Discharge   Problem: Health  Behavior/Discharge Planning: Goal: Compliance with prescribed medication regimen will improve Outcome: Adequate for Discharge   Problem: Nutritional: Goal: Ability to achieve adequate nutritional intake will improve Outcome: Adequate for Discharge   Problem: Role Relationship: Goal: Ability to communicate needs accurately will improve Outcome: Adequate for Discharge Goal: Ability to interact with others will improve Outcome: Adequate for Discharge   Problem: Safety: Goal: Ability to redirect hostility and anger into socially appropriate behaviors will improve Outcome: Adequate for Discharge Goal: Ability to remain free from injury will improve Outcome: Adequate for Discharge

## 2023-01-16 NOTE — Progress Notes (Signed)
  [facility] Case Management Discharge Plan :  Will you be returning to the same living situation after discharge:  No.  Kristine Garcia [phone] (Father)      At discharge, do you have transportation home?: No.pt will be provided a taxi to the [facility] Do you have the ability to pay for your medications: No.  Release of information consent forms completed and in the chart;  Patient's signature needed at discharge.  Patient to Follow up at:  [place]. Go to.   Specialty: Behavioral Health Why: Please go to this provider for an assessment, to obtain therapy and medication management services, on Monday through Friday, arrive by 7:20 am. Assessments are provided on a first come, first served basis. Contact information: [place] [phone]                Next level of care provider has access to [place] and Suicide Prevention discussed: Kristine Garcia,   Kristine Garcia [phone] (Father)          Has patient been referred to the Quitline?: Patient refused referral  Patient has been referred for addiction treatment: Yes Patient to continue working towards treatment goals after discharge. Patient no longer meets criteria for inpatient criteria per attending physician. Continue taking medications as prescribed, nursing to provide instructions at discharge. Follow up with all scheduled appointments.   Alira Fretwell S Khaleelah Yowell, LCSW 01/16/2023, 9:25 AM

## 2023-01-16 NOTE — Plan of Care (Signed)
  Problem: Education: Goal: Knowledge of [place] [facility] Education information/materials will improve Outcome: Progressing Goal: Emotional status will improve Outcome: Progressing   Problem: Activity: Goal: Sleeping patterns will improve Outcome: Progressing   Problem: Coping: Goal: Ability to demonstrate self-control will improve Outcome: Progressing   Problem: Safety: Goal: Periods of time without injury will increase Outcome: Progressing   Problem: Health Behavior/Discharge Planning: Goal: Identification of resources available to assist in meeting health care needs will improve Outcome: Progressing   Problem: Education: Goal: Ability to state activities that reduce stress will improve Outcome: Progressing   Problem: Safety: Goal: Ability to remain free from injury will improve Outcome: Progressing

## 2023-01-16 NOTE — Group Note (Signed)
Occupational Therapy Group Note   Group Topic:Goal Setting  Group Date: 01/15/2023 Start Time: [id number] End Time: 1500 Facilitators: Brantley Stage, OT   Group Description: Group encouraged engagement and participation through discussion focused on goal setting. Group members were introduced to goal-setting using the SMART Goal framework, identifying goals as Specific, Measureable, Acheivable, Relevant, and Time-Bound. Group members took time from group to create their own personal goal reflecting the SMART goal template and shared for review by peers and OT.    Therapeutic Goal(s):  Identify at least one goal that fits the SMART framework    Participation Level: Active and Engaged   Participation Quality: Independent   Behavior: Appropriate   Speech/Thought Process: Relevant   Affect/Mood: Appropriate   Insight: Fair   Judgement: Fair   Individualization: pt was engaged in their participation of group discussion/activity. New skills were identified  Modes of Intervention: Discussion and Education  Patient Response to Interventions:  Attentive   Plan: Continue to engage patient in OT groups 2 - 3x/week.  01/16/2023  Brantley Stage, OT Cornell Barman, OT

## 2023-01-16 NOTE — Discharge Summary (Signed)
Physician Discharge Summary Note  Patient:  Kristine Garcia is an 27 y.o., female MRN:  [id number] DOB:  [birth year] Patient phone:  [phone] (home)  Patient address:   [address] [place] [address],  Total Time spent with patient: 45 minutes  Date of Admission:  01/07/2023 Date of Discharge: 01/16/2023  Reason for Admission:   Analy Garcia is a single 27 y/o African-American female with past mental health history of MDD and GAD who presented to the [facility] on 02/03 with complaints of passive suicidal ideations & worsening depressive symptoms in the context of socioeconomic stressors. Pt was transferred voluntarily to this [facility] for treatment and stabilization of her mental status.    Principal Problem: MDD (major depressive disorder), recurrent severe, without psychosis ([place]) Discharge Diagnoses: Principal Problem:   MDD (major depressive disorder), recurrent severe, without psychosis ([place]) Active Problems:   PTSD (post-traumatic stress disorder)   GAD (generalized anxiety disorder)   UTI (urinary tract infection)  Past Psychiatric History: See H & P  Past Medical History:  Past Medical History:  Diagnosis Date   Known health problems: none    Vitamin D deficiency     Past Surgical History:  Procedure Laterality Date   NO PAST SURGERIES     Family History:  Family History  Problem Relation Age of Onset   Hypertension Mother    Hypertension Father    Sickle cell trait Maternal Grandmother    Family Psychiatric  History: See H & P Social History:  Social History   Substance and Sexual Activity  Alcohol Use Not Currently   Comment: social     Social History   Substance and Sexual Activity  Drug Use Yes   Types: Marijuana   Comment: occ    Social History   Socioeconomic History   Marital status: Single    Spouse name: Not on file   Number of children: Not on file   Years of education: Not on file   Highest  education level: Not on file  Occupational History   Not on file  Tobacco Use   Smoking status: Former    Types: Cigars   Smokeless tobacco: Never  Vaping Use   Vaping Use: Never used  Substance and Sexual Activity   Alcohol use: Not Currently    Comment: social   Drug use: Yes    Types: Marijuana    Comment: occ   Sexual activity: Not Currently    Birth control/protection: None  Other Topics Concern   Not on file  Social History Narrative   Not on file   Social Determinants of Health   Financial Resource Strain: Not on file  Food Insecurity: Not on file  Transportation Needs: Not on file  Physical Activity: Not on file  Stress: Not on file  Social Connections: Not on file                                      HOSPITAL COURSE During the patient's hospitalization, patient had extensive initial psychiatric evaluation, and follow-up psychiatric evaluations every day. Psychiatric diagnoses provided upon initial assessment are as listed above. Patient's psychiatric medications were adjusted on admission as follows: --Initiated Zyprexa tablet 5 mg p.o. daily QHS for psychosis --Initiated Prozac 20 mg p.o. daily for depression  -Initiated hydroxyzine 25 mg 3 times daily as needed/anxiety -Initiated trazodone 50 mg 1 p.o. daily  for insomnia PRN   During the hospitalization, other adjustments were made to the patient's psychiatric medication regimen. Medications at discharge are as follows: -Continue Zyprexa 5 mg BID for psychotic symptoms and mood stabilization -Continue Fluoxetine 40 mg once daily for MDD, GAD, PTSD -Continue Macrobid 100 mg BID for UTI x 6 more days, then follow up with PCP if symptoms persist -Continue Trazodone 50 mg nightly as needed for sleep -Continue Hydroxyzine 25 mg as needed for anxiety   Patient's care was discussed during the interdisciplinary team meeting every day during the hospitalization. The patient denies having side effects to prescribed  psychiatric medication. Gradually, patient started adjusting to milieu. The patient was evaluated each day by a clinical provider to ascertain response to treatment. Improvement was noted by the patient's report of decreasing symptoms, improved sleep and appetite, affect, medication tolerance, behavior, and participation in unit programming.  Patient was asked each day to complete a self inventory noting mood, mental status, pain, new symptoms, anxiety and concerns.     Symptoms were reported as significantly decreased or resolved completely by discharge. On day of discharge, the patient reports that their mood is stable. The patient denies having suicidal thoughts prior to discharge.  Patient denies having homicidal thoughts.  Patient denies having auditory hallucinations.  Patient denies any visual hallucinations or other symptoms of psychosis. The patient was motivated to continue taking medication with a goal of continued improvement in mental health.    The patient reports their target psychiatric symptoms of depression, anxiety and insomnia responded well to the psychiatric medications, and the patient reports overall benefit from this psychiatric hospitalization. Supportive psychotherapy was provided to the patient. The patient also participated in regular group therapy while hospitalized. Coping skills, problem solving as well as relaxation therapies were also part of the unit programming.   Labs were reviewed with the patient, and abnormal results were discussed with the patient. Lipid panel with cholesterol 223 & LDL 162. Pt educated on healthy food choices and on exercise, and the need to f/u with PCP. HA1C WNL @ 5.1. UA with large leukocytes and rare bacteria, pt symptomatic for UTI, being treated with macrobid. CMP for this admission WNL. UDS +THC, educated on substance use cessation and on the negative impact on her mental health. CBC WNL. Last TSH was on 09/15/22 and was WNL. EKG with QTC of  428.   The patient is able to verbalize their individual safety plan to this provider.   # It is recommended to the patient to continue psychiatric medications as prescribed, after discharge from the hospital.     # It is recommended to the patient to follow up with your outpatient psychiatric provider and PCP.   # It was discussed with the patient, the impact of alcohol, drugs, tobacco have been there overall psychiatric and medical wellbeing, and total abstinence from substance use was recommended the patient.ed.   # Prescriptions provided or sent directly to preferred pharmacy at discharge. Patient agreeable to plan. Given opportunity to ask questions. Appears to feel comfortable with discharge.    # In the event of worsening symptoms, the patient is instructed to call the crisis hotline (988), 911 and or go to the nearest ED for appropriate evaluation and treatment of symptoms. To follow-up with primary care provider for other medical issues, concerns and or health care needs   # Patient was discharged home to her father's house with a plan to follow up as noted below.   Physical  Findings: AIMS: Facial and Oral Movements Muscles of Facial Expression: None, normal Lips and Perioral Area: None, normal Jaw: None, normal Tongue: None, normal,Extremity Movements Upper (arms, wrists, hands, fingers): None, normal Lower (legs, knees, ankles, toes): None, normal, Trunk Movements Neck, shoulders, hips: None, normal, Overall Severity Severity of abnormal movements (highest score from questions above): None, normal Incapacitation due to abnormal movements: None, normal Patient's awareness of abnormal movements (rate only patient's report): No Awareness, Dental Status Current problems with teeth and/or dentures?: No Does patient usually wear dentures?: No  CIWA:  n/a COWS:   n/a  Musculoskeletal: Strength & Muscle Tone: within normal limits Gait & Station: normal Patient leans:  N/A  Psychiatric Specialty Exam:  Presentation  General Appearance:  Appropriate for Environment; Fairly Groomed  Eye Contact: Good  Speech: Clear and Coherent  Speech Volume: Normal  Handedness: Right   Mood and Affect  Mood: Euthymic  Affect: Congruent   Thought Process  Thought Processes: Coherent  Descriptions of Associations:Intact  Orientation:Full (Time, Place and Person)  Thought Content:Logical  History of Schizophrenia/Schizoaffective disorder:No  Duration of Psychotic Symptoms:N/A  Hallucinations:Hallucinations: None  Ideas of Reference:None  Suicidal Thoughts:Suicidal Thoughts: No SI Passive Intent and/or Plan: Without Intent; Without Plan  Homicidal Thoughts:Homicidal Thoughts: No   Sensorium  Memory: Immediate Good; Recent Good; Remote Good  Judgment: Fair  Insight: Fair   Community education officer  Concentration: Good  Attention Span: Good  Recall: Good  Fund of Knowledge: Good  Language: Good   Psychomotor Activity  Psychomotor Activity: Psychomotor Activity: Normal   Assets  Assets: Communication Skills   Sleep  Sleep: Sleep: Good    Physical Exam: Physical Exam Constitutional:      Appearance: Normal appearance.  HENT:     Head: Normocephalic.     Nose: Nose normal. No congestion or rhinorrhea.  Eyes:     Pupils: Pupils are equal, round, and reactive to light.  Pulmonary:     Effort: Pulmonary effort is normal. No respiratory distress.  Musculoskeletal:     Cervical back: Normal range of motion.  Neurological:     Mental Status: She is alert and oriented to person, place, and time.  Psychiatric:        Behavior: Behavior normal.        Thought Content: Thought content normal.    Review of Systems  Constitutional: Negative.   HENT: Negative.    Eyes: Negative.   Respiratory: Negative.    Cardiovascular: Negative.   Gastrointestinal: Negative.   Genitourinary: Negative.    Musculoskeletal: Negative.   Skin: Negative.   Neurological: Negative.   Psychiatric/Behavioral:  Positive for depression (Denies SI, denies HI, denies AVH and verbally contracts for safety outside of this [facility]. Depressive symptoms have significantly improved prior to discharge from hospital.). Negative for hallucinations, memory loss, substance abuse and suicidal ideas. The patient is nervous/anxious (Resolving on current medications) and has insomnia (Resolving on current medications).    Blood pressure 117/84, pulse 78, temperature 98.1 F (36.7 C), temperature source Oral, resp. rate 16, height 5' 8"$  (1.727 m), weight 112.5 kg, SpO2 100 %. Body mass index is 37.71 kg/m.   Social History   Tobacco Use  Smoking Status Former   Types: Cigars  Smokeless Tobacco Never   Tobacco Cessation:  N/A, patient does not currently use tobacco products   Blood Alcohol level:  Lab Results  Component Value Date   ETH <10 01/06/2023   ETH <10 123XX123    Metabolic Disorder Labs:  Lab Results  Component Value Date   HGBA1C 5.1 01/15/2023   MPG 99.67 01/15/2023   MPG 102.54 09/15/2022   Lab Results  Component Value Date   PROLACTIN 14.9 11/17/2021   Lab Results  Component Value Date   CHOL 223 (H) 01/15/2023   TRIG 45 01/15/2023   HDL 52 01/15/2023   CHOLHDL 4.3 01/15/2023   VLDL 9 01/15/2023   LDLCALC 162 (H) 01/15/2023   LDLCALC 128 (H) 09/15/2022    See Psychiatric Specialty Exam and Suicide Risk Assessment completed by Attending Physician prior to discharge.  Discharge destination:  Home  Is patient on multiple antipsychotic therapies at discharge:  No   Has Patient had three or more failed trials of antipsychotic monotherapy by history:  No  Recommended Plan for Multiple Antipsychotic Therapies: NA  Discharge Instructions     Diet - low sodium heart healthy   Complete by: As directed    Diet - low sodium heart healthy   Complete by: As directed    Increase  activity slowly   Complete by: As directed    Increase activity slowly   Complete by: As directed       Allergies as of 01/16/2023   No Known Allergies      Medication List     STOP taking these medications    Frotek 10 % Crea Generic drug: Ketoprofen   gabapentin 300 MG capsule Commonly known as: NEURONTIN       TAKE these medications      Indication  FLUoxetine 40 MG capsule Commonly known as: PROZAC Take 1 capsule (40 mg total) by mouth daily.  Indication: Major Depressive Disorder   hydrOXYzine 25 MG tablet Commonly known as: ATARAX Take 1 tablet (25 mg total) by mouth 3 (three) times daily as needed for anxiety.  Indication: Feeling Anxious   nitrofurantoin (macrocrystal-monohydrate) 100 MG capsule Commonly known as: MACROBID Take 1 capsule (100 mg total) by mouth every 12 (twelve) hours for 6 days.  Indication: Simple Infection of the Urinary Tract   OLANZapine 5 MG tablet Commonly known as: ZYPREXA Take 1 tablet (5 mg total) by mouth 2 (two) times daily.  Indication: Major Depressive Disorder   traZODone 50 MG tablet Commonly known as: DESYREL Take 1 tablet (50 mg total) by mouth at bedtime as needed for sleep.  Indication: [place]. Go to.   Specialty: Behavioral Health Why: Please go to this provider for an assessment, to obtain therapy and medication management services, on Monday through Friday, arrive by 7:20 am. Assessments are provided on a first come, first served basis. Contact information: [place] [id number] [phone]               Signed: Nicholes Rough, NP 01/16/2023, 12:46 PM

## 2023-01-22 ENCOUNTER — Ambulatory Visit (HOSPITAL_COMMUNITY)
Admission: EM | Admit: 2023-01-22 | Discharge: 2023-01-23 | Disposition: A | Discharge status: Home / Self Care | Payer: No Payment, Other | Attending: Psychiatry | Admitting: Psychiatry

## 2023-01-22 DIAGNOSIS — F332 Major depressive disorder, recurrent severe without psychotic features: Secondary | ICD-10-CM | POA: Insufficient documentation

## 2023-01-22 DIAGNOSIS — Z79899 Other long term (current) drug therapy: Secondary | ICD-10-CM | POA: Insufficient documentation

## 2023-01-22 DIAGNOSIS — Z1152 Encounter for screening for COVID-19: Secondary | ICD-10-CM | POA: Diagnosis not present

## 2023-01-22 DIAGNOSIS — R45850 Homicidal ideations: Secondary | ICD-10-CM

## 2023-01-22 LAB — POCT URINE DRUG SCREEN - MANUAL ENTRY (I-SCREEN)
POC Amphetamine UR: NOT DETECTED
POC Buprenorphine (BUP): NOT DETECTED
POC Cocaine UR: NOT DETECTED
POC Marijuana UR: POSITIVE — AB
POC Methadone UR: NOT DETECTED
POC Methamphetamine UR: NOT DETECTED
POC Morphine: NOT DETECTED
POC Oxazepam (BZO): NOT DETECTED
POC Oxycodone UR: NOT DETECTED
POC Secobarbital (BAR): NOT DETECTED

## 2023-01-22 LAB — RESP PANEL BY RT-PCR (RSV, FLU A&B, COVID)  RVPGX2
Influenza A by PCR: NEGATIVE
Influenza B by PCR: NEGATIVE
Resp Syncytial Virus by PCR: NEGATIVE
SARS Coronavirus 2 by RT PCR: NEGATIVE

## 2023-01-22 LAB — POC SARS CORONAVIRUS 2 AG: SARSCOV2ONAVIRUS 2 AG: NEGATIVE

## 2023-01-22 MED ORDER — HYDROXYZINE HCL 25 MG PO TABS
25.0000 mg | ORAL_TABLET | Freq: Three times a day (TID) | ORAL | Status: DC | PRN
  Administered 2023-01-22: 25 mg via ORAL
  Filled 2023-01-22: qty 1

## 2023-01-22 MED ORDER — ALUM & MAG HYDROXIDE-SIMETH 200-200-20 MG/5ML PO SUSP: 30.0000 mL | ORAL | Status: DC | PRN

## 2023-01-22 MED ORDER — ACETAMINOPHEN 325 MG PO TABS: 650.0000 mg | ORAL_TABLET | Freq: Four times a day (QID) | ORAL | Status: DC | PRN

## 2023-01-22 MED ORDER — MAGNESIUM HYDROXIDE 400 MG/5ML PO SUSP: 30.0000 mL | Freq: Every day | ORAL | Status: DC | PRN

## 2023-01-22 MED ORDER — FLUOXETINE HCL 20 MG PO CAPS
40.0000 mg | ORAL_CAPSULE | Freq: Every day | ORAL | Status: DC
  Administered 2023-01-22: 40 mg via ORAL
  Filled 2023-01-22 (×2): qty 2

## 2023-01-22 MED ORDER — TRAZODONE HCL 50 MG PO TABS
50.0000 mg | ORAL_TABLET | Freq: Every evening | ORAL | Status: DC | PRN
  Filled 2023-01-22: qty 1

## 2023-01-22 MED ORDER — OLANZAPINE 5 MG PO TABS
5.0000 mg | ORAL_TABLET | Freq: Two times a day (BID) | ORAL | Status: DC
  Administered 2023-01-22 (×2): 5 mg via ORAL
  Filled 2023-01-22 (×3): qty 1

## 2023-01-22 MED ORDER — OLANZAPINE 10 MG PO TABS
10.0000 mg | ORAL_TABLET | Freq: Two times a day (BID) | ORAL | Status: DC
  Filled 2023-01-22: qty 1

## 2023-01-22 NOTE — ED Notes (Signed)
Pt sleeping@this time. Breathing even and unlabored. Will continue to monitor for safety 

## 2023-01-22 NOTE — ED Notes (Signed)
Pt awake & resting at present.  No distress noted.  Monitoring for safety.

## 2023-01-22 NOTE — ED Notes (Signed)
Assumed care of patient at 0730. Pt observed resting quietly respirations even unlabored.

## 2023-01-22 NOTE — ED Notes (Signed)
Pt given breakfast of muffin and juice. Pt states '' my discharge plan didn't work. They my family, told them lies, said they were going to help me but they didn't. I'm still having thoughts of hurting them because they don't even help me. ''  Pt is cooperative pleasant. Pt is safe, does not appear to be responding to internal stimuli. Will con't to monitor.
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
## 2023-01-22 NOTE — ED Provider Notes (Signed)
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  mg/dL   High  >190     mg/dL   Very High Performed at [facility] [address] [place], [place] [address]    TSH 09/15/2022 1.057  0.350 - 4.500 uIU/mL Final   Comment: Performed by a 3rd Generation assay with a functional  sensitivity of <=0.01 uIU/mL. Performed at [facility], [place] [address], [place], [place] [address]    POC Amphetamine UR 09/15/2022 None Detected  NONE DETECTED  (Cut Off Level 1000 ng/mL) Final   POC Secobarbital (BAR) 09/15/2022 None Detected  NONE DETECTED (Cut Off Level 300 ng/mL) Final   POC Buprenorphine (BUP) 09/15/2022 None Detected  NONE DETECTED (Cut Off Level 10 ng/mL) Final   POC Oxazepam (BZO) 09/15/2022 None Detected  NONE DETECTED (Cut Off Level 300 ng/mL) Final   POC Cocaine UR 09/15/2022 None Detected  NONE DETECTED (Cut Off Level 300 ng/mL) Final   POC Methamphetamine UR 09/15/2022 None Detected  NONE DETECTED (Cut Off Level 1000 ng/mL) Final   POC Morphine 09/15/2022 None Detected  NONE DETECTED (Cut Off Level 300 ng/mL) Final   POC Methadone UR 09/15/2022 None Detected  NONE DETECTED (Cut Off Level 300 ng/mL) Final   POC Oxycodone UR 09/15/2022 None Detected  NONE DETECTED (Cut Off Level 100 ng/mL) Final   POC Marijuana UR 09/15/2022 Positive (A)  NONE DETECTED (Cut Off Level 50 ng/mL) Final   SARSCOV2ONAVIRUS 2 AG 09/15/2022 NEGATIVE  NEGATIVE Final   Comment: (NOTE) SARS-CoV-2 antigen NOT DETECTED.   Negative results are presumptive.  Negative results do not preclude SARS-CoV-2 infection and should not be used as the sole basis for treatment or other patient management decisions, including infection  control decisions, particularly in the presence of clinical signs and  symptoms consistent with COVID-19, or in those who have been in contact with the virus.  Negative results must be combined with clinical observations, patient history, and epidemiological information. The expected result is Negative.  Fact Sheet for Patients: HandmadeRecipes.com.cy  Fact Sheet for Healthcare Providers: FuneralLife.at  This test is not yet approved or cleared by the [place] FDA and  has been authorized for detection and/or diagnosis of SARS-CoV-2 by FDA under an Emergency Use Authorization (EUA).  This EUA will remain in effect (meaning this test can be used) for the duration of  the COV                           ID-19 declaration under Section 564(b)(1) of the Act, 21 U.S.C. section 360bbb-3(b)(1), unless the authorization is terminated or revoked sooner.     Preg Test, Ur 09/15/2022 NEGATIVE  NEGATIVE Final   Comment:        THE SENSITIVITY OF THIS METHODOLOGY IS >24 mIU/mL     Allergies: Patient has no known allergies.  Medications:  Facility Ordered Medications  Medication   acetaminophen (TYLENOL) tablet 650 mg   alum & mag hydroxide-simeth (MAALOX/MYLANTA) 200-200-20 MG/5ML suspension 30 mL   magnesium hydroxide (MILK OF MAGNESIA) suspension 30 mL   traZODone (DESYREL) tablet 50 mg   FLUoxetine (PROZAC) capsule 40 mg   hydrOXYzine (ATARAX) tablet 25 mg   OLANZapine (ZYPREXA) tablet 10 mg   PTA Medications  Medication Sig   nitrofurantoin, macrocrystal-monohydrate, (MACROBID) 100 MG capsule Take 1 capsule (100 mg total) by mouth every 12 (twelve) hours for 6 days.   FLUoxetine (PROZAC) 40 MG capsule Take 1 capsule (40 mg total) by mouth daily.   hydrOXYzine (ATARAX) 25 MG tablet Take 1 tablet (25 mg total) by mouth 3 (three) times daily as needed for anxiety.   OLANZapine (ZYPREXA) 5 MG tablet Take 1 tablet (5 mg total) by mouth 2 (two) times daily.   traZODone (DESYREL) 50 MG tablet Take 1 tablet (50  mg total) by mouth at bedtime as needed for sleep.    Medical Decision Making  Admit to Observation unit and initiate safety precautions per unit protocol  Medications:   Acetaminophen 650 mg PO Q 6hr PRN for mild pain Maalox 30 ml PO Q 4 PRN for indigestion Milk of Magnesia 30 ml PO QD PRN for mild constipation  Restart home medications:   Fluoxetine 40 mg PO Q day Hydroxyzine 25 mg PO TID PRN Olanzapine  5 mg PO BID Trazodone 50 mg PO HS PRN  Labs ordered: POC/SARS Coronavirus  UDS       Recommendations  Based on my evaluation the patient does not appear to have an emergency medical condition.   Admit to Observation unit for overnight  monitoring. To be reevaluated in AM to determine placement.  Ronelle Nigh, NP 01/22/23  1:41 AM

## 2023-01-22 NOTE — ED Provider Notes (Cosign Needed Addendum)
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 [facility],  [address] [place], [place] [address]    TSH 09/15/2022 1.057  0.350 - 4.500 uIU/mL Final   Comment: Performed by a 3rd Generation assay with a functional sensitivity of <=0.01 uIU/mL. Performed at [facility], [place] [address] [place], [place] [address]    POC Amphetamine UR  09/15/2022 None Detected  NONE DETECTED (Cut Off Level 1000 ng/mL) Final   POC Secobarbital (BAR) 09/15/2022 None Detected  NONE DETECTED (Cut Off Level 300 ng/mL) Final   POC Buprenorphine (BUP) 09/15/2022 None Detected  NONE DETECTED (Cut Off Level 10 ng/mL) Final   POC Oxazepam (BZO) 09/15/2022 None Detected  NONE DETECTED (Cut Off Level 300 ng/mL) Final   POC Cocaine UR 09/15/2022 None Detected  NONE DETECTED (Cut Off Level 300 ng/mL) Final   POC Methamphetamine UR 09/15/2022 None Detected  NONE DETECTED (Cut Off Level 1000 ng/mL) Final   POC Morphine 09/15/2022 None Detected  NONE DETECTED (Cut Off Level 300 ng/mL) Final   POC Methadone UR 09/15/2022 None Detected  NONE DETECTED (Cut Off Level 300 ng/mL) Final   POC Oxycodone UR 09/15/2022 None Detected  NONE DETECTED (Cut Off Level 100 ng/mL) Final   POC Marijuana UR 09/15/2022 Positive (A)  NONE DETECTED (Cut Off Level 50 ng/mL) Final   SARSCOV2ONAVIRUS 2 AG 09/15/2022 NEGATIVE  NEGATIVE Final   Comment: (NOTE) SARS-CoV-2 antigen NOT DETECTED.   Negative results are presumptive.  Negative results do not preclude SARS-CoV-2 infection and should not be used as the sole basis for treatment or other patient management decisions, including infection  control decisions, particularly in the presence of clinical signs and  symptoms consistent with COVID-19, or in those who have been in contact with the virus.  Negative results must be combined with clinical observations, patient history, and epidemiological information. The expected result is Negative.  Fact Sheet for Patients: HandmadeRecipes.com.cy  Fact Sheet for Healthcare Providers: FuneralLife.at  This test is not yet approved or cleared by the [place] FDA and  has been authorized for detection and/or diagnosis of SARS-CoV-2 by FDA under an Emergency Use Authorization (EUA).  This EUA will remain in effect (meaning this test can  be used) for the duration of  the COV                          ID-19 declaration under Section 564(b)(1) of the Act, 21 U.S.C. section 360bbb-3(b)(1), unless the authorization is terminated or revoked sooner.     Preg Test, Ur 09/15/2022 NEGATIVE  NEGATIVE Final   Comment:        THE SENSITIVITY OF THIS METHODOLOGY IS >24 mIU/mL     Blood Alcohol level:  Lab Results  Component Value Date   ETH <10 01/06/2023   ETH <10 123XX123    Metabolic Disorder Labs: Lab Results  Component Value Date   HGBA1C 5.1 01/15/2023   MPG 99.67 01/15/2023   MPG 102.54 09/15/2022   Lab Results  Component Value Date   PROLACTIN 14.9 11/17/2021   Lab Results  Component Value Date   CHOL 223 (H) 01/15/2023   TRIG 45 01/15/2023   HDL 52 01/15/2023   CHOLHDL 4.3 01/15/2023   VLDL 9 01/15/2023   LDLCALC 162 (H) 01/15/2023   LDLCALC 128 (H) 09/15/2022    Therapeutic Lab Levels: No results found for: "LITHIUM" No results found for: "VALPROATE" No results found for: "CBMZ"  Physical Findings   AIMS    Flowsheet Row Admission (  Discharged) from 01/07/2023 in [place] [facility]  AIMS Total Score 0      AUDIT    Flowsheet Row Admission (Discharged) from 01/07/2023 in [place] [facility]  Alcohol Use Disorder Identification Test Final Score (AUDIT) 0      PHQ2-9    Flowsheet Row ED from 01/22/2023 in [facility] ED from 01/06/2023 in [facility]  PHQ-2 Total Score 2 2  PHQ-9 Total Score 6 9      [place] [facility] from 01/22/2023 in [facility] Admission (Discharged) from 01/07/2023 in [place] [facility] ED from 01/06/2023 in [place] No Risk Low Risk No Risk        Musculoskeletal  Strength & Muscle Tone: within normal limits Gait & Station: normal Patient leans:  N/A  Psychiatric Specialty Exam  Presentation General Appearance: Appropriate for Environment  Eye Contact:Fair  Speech:Clear and Coherent  Speech Volume:Normal  Handedness:-- (not assessed)   Mood and Affect  Mood:Euthymic  Affect:Congruent   Thought Process  Thought Processes:Coherent; Linear  Descriptions of Associations:Intact  Orientation:Full (Time, Place and Person)  Thought Content:Logical    Hallucinations:Hallucinations: None  Ideas of Reference:None  Suicidal Thoughts:Suicidal Thoughts: yes  Homicidal Thoughts:Homicidal Thoughts: yes   Sensorium  Memory:Immediate Fair; Recent Fair; Remote Fair  Judgment:Fair  Insight:Fair   Executive Functions  Concentration:Fair  Attention Span:Fair  [place]   Psychomotor Activity  Psychomotor Activity:Psychomotor Activity: Normal   Assets  Assets:Communication Skills; Resilience   Sleep  Sleep:Sleep: Fair   Nutritional Assessment (For OBS and FBC admissions only) Has the patient had a weight loss or gain of 10 pounds or more in the last 3 months?: No Has the patient had a decrease in food intake/or appetite?: Yes Does the patient have dental problems?: No Does the patient have eating habits or behaviors that may be indicators of an eating disorder including binging or inducing vomiting?: No Has the patient recently lost weight without trying?: 0 Has the patient been eating poorly because of a decreased appetite?: 0 Malnutrition Screening Tool Score: 0    Physical Exam Constitutional:      Appearance: the patient is not toxic-appearing.  Pulmonary:     Effort: Pulmonary effort is normal.  Neurological:     General: No focal deficit present.     Mental Status: the patient is alert and oriented to person, place, and time.   Review of Systems  Respiratory:  Negative for shortness of breath.   Cardiovascular:  Negative for chest pain.   Gastrointestinal:  Negative for abdominal pain, constipation, diarrhea, nausea and vomiting.  Neurological:  Negative for headaches.    BP 112/72 (BP Location: Left Arm)   Pulse 74   Temp 98 F (36.7 C)   Resp 18   SpO2 100%   Treatment Plan Summary: Daily contact with patient to assess and evaluate symptoms and progress in treatment and Medication management  Status: Voluntary  Homicidal thoughts - Attempt to contact collateral - Reassess tomorrow morning  Major depressive disorder, reported PTSD - Continue home medications as ordered: Zyprexa 5 mg twice daily and Prozac 40 mg daily - Patient plans to follow-up with therapy early tomorrow morning - She states she will go to the [facility] afterwards  Medical: - UDS with marijuana - No routine labs given recent labs collected at behavioral hospital  Corky Sox, MD 01/22/2023 10:56 AM

## 2023-01-22 NOTE — ED Notes (Signed)
Pt is sleeping. No distress noted. Will continue to monitor safety.

## 2023-01-22 NOTE — ED Triage Notes (Signed)
Pt presents to [facility] voluntarily, unaccompanied at this time with complaint of homelessness and anxiety. Pt states " well it didn't work". Pt was recently discharged from [facility] recently and was then encouraged to obtain outpatient services. Pt indicated that she would like to receive outpatient services, but does not have a car or way to get here. Pt then asked to stay in the building until outpatient services are offered this morning. Pt reports being prescribed Zyprexa and Trazadone and is compliant at this time. Pt currently denies SI, HI, AVH and substance/alcohol use.

## 2023-01-23 MED ORDER — OLANZAPINE 5 MG PO TABS: 5.0000 mg | ORAL_TABLET | Freq: Two times a day (BID) | ORAL | 0 refills | Status: AC

## 2023-01-23 MED ORDER — HYDROXYZINE HCL 25 MG PO TABS: 25.0000 mg | ORAL_TABLET | Freq: Three times a day (TID) | ORAL | 0 refills | Status: AC | PRN

## 2023-01-23 MED ORDER — FLUOXETINE HCL 40 MG PO CAPS: 40.0000 mg | ORAL_CAPSULE | Freq: Every day | ORAL | 0 refills | Status: AC

## 2023-01-23 MED ORDER — TRAZODONE HCL 50 MG PO TABS: 50.0000 mg | ORAL_TABLET | Freq: Every evening | ORAL | 0 refills | Status: AC | PRN

## 2023-01-23 NOTE — ED Notes (Signed)
Patient A&O x 4, ambulatory. Patient discharged in no acute distress. Patient denied SI/HI, A/VH upon discharge. Patient verbalized understanding of all discharge instructions explained by staff, to include follow up appointments, RX's and safety plan. Patient reported mood 10/10.  Pt belongings returned to patient from locker # 26 intact. Patient escorted to lobby via staff for transport to destination. Safety maintained.

## 2023-01-23 NOTE — ED Notes (Signed)
Pt sleeping@this time. Breathing even and unlabored. Will continue to monitor for safety 

## 2023-01-23 NOTE — Discharge Instructions (Addendum)
Patient is instructed prior to discharge to:  Take all medications as prescribed by his/her mental healthcare provider. Report any adverse effects and or reactions from the medicines to his/her outpatient provider promptly. Keep all scheduled appointments, to ensure that you are getting refills on time and to avoid any interruption in your medication.  If you are unable to keep an appointment call to reschedule.  Be sure to follow-up with resources and follow-up appointments provided.  Patient has been instructed & cautioned: To not engage in alcohol and or illegal drug use while on prescription medicines. In the event of worsening symptoms, patient is instructed to call the crisis hotline, 911 and or go to the nearest ED for appropriate evaluation and treatment of symptoms. To follow-up with his/her primary care provider for your other medical issues, concerns and or health care needs.   Please come to [facility] during walk in hours to establish care for medication management or therapy:   Address:  [address], in [place], [place] Ph: [phone]   New patient medication management hours: Monday-Friday from 8 AM to 11 AM Please show up by 7:00 AM to ensure you are seen  New patient therapy walk in hours: Mondays, Wednesdays, and Thursdays from 7:30 AM to 4 PM Please show up by 7:00 AM to ensure you are seen

## 2023-01-23 NOTE — ED Notes (Signed)
Patient alert and oriented x 3. Denies SI/HI/AVH. Denies intent or plan to harm self or others. Routine conducted according to faculty protocol. Encourage patient to notify staff with any needs or concerns. Patient verbalized agreement and understanding. Will continue to monitor for safety. 

## 2023-01-23 NOTE — ED Notes (Signed)
Patient refused moring medication.

## 2023-01-23 NOTE — ED Notes (Signed)
Pt sleeping at present, no distress noted.  Monitoring for safety. 

## 2023-01-23 NOTE — ED Notes (Signed)
Patient  sleeping in no acute stress. RR even and unlabored .Environment secured .Will continue to monitor for safely. 
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
## 2023-01-23 NOTE — ED Notes (Signed)
Patient is laying on the floor,states that she is ready to go.Notified provider that she refused her morning medication.Notifed provider she is laying on the floor.

## 2023-08-09 ENCOUNTER — Ambulatory Visit (INDEPENDENT_AMBULATORY_CARE_PROVIDER_SITE_OTHER): Payer: MEDICAID | Admitting: Mental Health

## 2023-08-09 DIAGNOSIS — F323 Major depressive disorder, single episode, severe with psychotic features: Secondary | ICD-10-CM | POA: Diagnosis not present

## 2023-08-09 DIAGNOSIS — F4310 Post-traumatic stress disorder, unspecified: Secondary | ICD-10-CM

## 2023-08-09 DIAGNOSIS — F411 Generalized anxiety disorder: Secondary | ICD-10-CM
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
## 2023-09-07 ENCOUNTER — Encounter (HOSPITAL_COMMUNITY): Payer: Self-pay | Admitting: Emergency Medicine

## 2023-09-07 ENCOUNTER — Ambulatory Visit (HOSPITAL_COMMUNITY): Payer: MEDICAID | Admitting: Physician Assistant

## 2023-09-07 ENCOUNTER — Ambulatory Visit (HOSPITAL_COMMUNITY)
Admission: EM | Admit: 2023-09-07 | Discharge: 2023-09-07 | Disposition: A | Discharge status: Home / Self Care | Payer: MEDICAID | Attending: Internal Medicine | Admitting: Internal Medicine

## 2023-09-07 DIAGNOSIS — Z113 Encounter for screening for infections with a predominantly sexual mode of transmission: Secondary | ICD-10-CM | POA: Diagnosis present

## 2023-09-07 DIAGNOSIS — N939 Abnormal uterine and vaginal bleeding, unspecified: Secondary | ICD-10-CM

## 2023-09-07 DIAGNOSIS — B3731 Acute candidiasis of vulva and vagina: Secondary | ICD-10-CM | POA: Insufficient documentation

## 2023-09-07 LAB — POCT URINALYSIS DIP (MANUAL ENTRY)
Glucose, UA: NEGATIVE mg/dL
Leukocytes, UA: NEGATIVE
Nitrite, UA: NEGATIVE
Protein Ur, POC: 30 mg/dL — AB
Spec Grav, UA: >=1.03 — AB (ref 1.010–1.025)
Urobilinogen, UA: 1 U/dL
pH, UA: 6 (ref 5.0–8.0)

## 2023-09-07 LAB — POCT URINE PREGNANCY: Preg Test, Ur: NEGATIVE

## 2023-09-07 NOTE — ED Provider Notes (Signed)
[facility]    CSN: [id number] Arrival date & time: 09/07/23  0802      History   Chief Complaint Chief Complaint  Patient presents with   Vaginal Bleeding   SEXUALLY TRANSMITTED DISEASE    HPI Kristine Garcia is a 27 y.o. female.    Vaginal Bleeding Associated symptoms: dysuria and vaginal discharge   Associated symptoms: no fever   Request testing for STIs, she is concerned due to something she saw on Facebook.  She admits frequency, dysuria, hematuria, brown to bloody vaginal discharge x 1 day, vaginal itching.  LMP 2 weeks ago.  Genital rash fever, chills, sweats, nausea, vomiting.  Patient states she fainted 1 week ago after walking, she was seen and treated at a hospital in [place], has had a lot of testing without significant findings.  Has not had any fainting episodes since then.  Past Medical History:  Diagnosis Date   Known health problems: none    Vitamin D deficiency     Patient Active Problem List   Diagnosis Date Noted   UTI (urinary tract infection) 01/15/2023   PTSD (post-traumatic stress disorder) 01/09/2023   GAD (generalized anxiety disorder) 01/09/2023   MDD (major depressive disorder), recurrent severe, without psychosis (HCC) 11/17/2021   Vitamin D deficiency 09/24/2019    Past Surgical History:  Procedure Laterality Date   NO PAST SURGERIES      OB History   No obstetric history on file.      Home Medications    Prior to Admission medications   Medication Sig Start Date End Date Taking? Authorizing Provider  FLUoxetine (PROZAC) 40 MG capsule Take 1 capsule (40 mg total) by mouth daily. Patient not taking: Reported on 09/07/2023 01/23/23 02/22/23  Carlyn Reichert, MD  hydrOXYzine (ATARAX) 25 MG tablet Take 1 tablet (25 mg total) by mouth 3 (three) times daily as needed for anxiety. Patient not taking: Reported on 09/07/2023 01/23/23   Carlyn Reichert, MD  OLANZapine (ZYPREXA) 5 MG tablet Take 1 tablet (5 mg total) by mouth 2 (two)  times daily. Patient not taking: Reported on 09/07/2023 01/23/23 02/22/23  Carlyn Reichert, MD  traZODone (DESYREL) 50 MG tablet Take 1 tablet (50 mg total) by mouth at bedtime as needed for sleep. Patient not taking: Reported on 09/07/2023 01/23/23 02/22/23  Carlyn Reichert, MD  Vitamin D, Ergocalciferol, (DRISDOL) 1.25 MG (50000 UNIT) CAPS capsule Take 50,000 Units by mouth every Tuesday.    [provider]    Family History Family History  Problem Relation Age of Onset   Hypertension Mother    Hypertension Father    Sickle cell trait Maternal Grandmother     Social History Social History   Tobacco Use   Smoking status: Some Days    Types: Cigars   Smokeless tobacco: Never  Vaping Use   Vaping status: Never Used  Substance Use Topics   Alcohol use: Never    Comment: social   Drug use: Yes    Types: Marijuana    Comment: occ     Allergies   Patient has no known allergies.   Review of Systems Review of Systems  Constitutional:  Negative for appetite change, chills and fever.  Genitourinary:  Positive for dysuria, frequency, hematuria, menstrual problem, pelvic pain (Lower abdominal pressure), urgency, vaginal bleeding and vaginal discharge. Negative for flank pain, genital sores and vaginal pain.     Physical Exam Triage Vital Signs ED Triage Vitals  Encounter Vitals Group  BP 09/07/23 0819 131/83     Systolic BP Percentile --      Diastolic BP Percentile --      Pulse Rate 09/07/23 0819 76     Resp 09/07/23 0819 16     Temp 09/07/23 0819 98.4 F (36.9 C)     Temp Source 09/07/23 0819 Oral     SpO2 09/07/23 0819 99 %     Weight --      Height --      Head Circumference --      Peak Flow --      Pain Score 09/07/23 0816 7     Pain Loc --      Pain Education --      Exclude from Growth Chart --    No data found.  Updated Vital Signs BP 131/83 (BP Location: Right Arm)   Pulse 76   Temp 98.4 F (36.9 C) (Oral)   Resp 16   LMP 08/15/2023  (Approximate)   SpO2 99%   Visual Acuity Right Eye Distance:   Left Eye Distance:   Bilateral Distance:    Right Eye Near:   Left Eye Near:    Bilateral Near:     Physical Exam Vitals reviewed. Exam conducted with a chaperone present.  Constitutional:      Appearance: Normal appearance. She is not ill-appearing or toxic-appearing.  Eyes:     Conjunctiva/sclera: Conjunctivae normal.  Cardiovascular:     Rate and Rhythm: Normal rate and regular rhythm.     Heart sounds: Normal heart sounds.  Pulmonary:     Effort: Pulmonary effort is normal. No respiratory distress.     Breath sounds: Normal breath sounds. No wheezing, rhonchi or rales.  Abdominal:     General: Bowel sounds are normal.     Palpations: Abdomen is soft.     Tenderness: There is abdominal tenderness (Mild suprapubic tenderness without rebound or guarding). There is no right CVA tenderness or left CVA tenderness.  Genitourinary:    Exam position: Lithotomy position.     Pubic Area: No rash.      Labia:        Right: No rash, tenderness or lesion.        Left: No rash, tenderness or injury.      Vagina: Bleeding present.     Cervix: No friability, lesion or erythema.  Skin:    General: Skin is warm and dry.  Neurological:     Mental Status: She is alert.      UC Treatments / Results  Labs (all labs ordered are listed, but only abnormal results are displayed) Labs Reviewed  POCT URINALYSIS DIP (MANUAL ENTRY) - Abnormal; Notable for the following components:      Result Value   Color, UA orange (*)    Clarity, UA cloudy (*)    Bilirubin, UA small (*)    Ketones, POC UA trace (5) (*)    Spec Grav, UA >=1.030 (*)    Blood, UA large (*)    Protein Ur, POC =30 (*)    All other components within normal limits  POCT URINE PREGNANCY  CERVICOVAGINAL ANCILLARY ONLY    EKG   Radiology No results found.  Procedures Procedures (including critical care time)  Medications Ordered in UC Medications -  No data to display  Initial Impression / Assessment and Plan / UC Course  I have reviewed the triage vital signs and the nursing notes.  Pertinent labs & imaging results  that were available during my care of the patient were reviewed by me and considered in my medical decision making (see chart for details).     27 year old sexually active female wants to be checked for STIs.  Notes brown then bloody vaginal discharge x 1 day, admits dysuria, frequency, hematuria.  Urinalysis with blood however has vaginal bleeding on exam without other vaginal or genital abnormality. Will wait for cytology results before starting treatment.  Back counseled to follow-up with GYN if bleeding persists Final Clinical Impressions(s) / UC Diagnoses   Final diagnoses:  None   Discharge Instructions   None    ED Prescriptions   None    PDMP not reviewed this encounter.   Meliton Rattan, [place] 09/07/23 [phone]

## 2023-09-07 NOTE — ED Triage Notes (Signed)
Pt here for std testing. States previous partner has possible std.  She also c/o irregular bright vaginal bleeding and pain that start this week and she has already had menstrual cycle for month.  Pt also c/o syncopal episode last week after walking.

## 2023-09-07 NOTE — Discharge Instructions (Addendum)
Your STI results will be available on your chart account.  We will contact you if anything is positive and requires treatment.  Follow-up with a gynecologist if the bleeding continues.  Emergency department for severe bleeding worsening abdominal pain or concerns

## 2023-09-10 ENCOUNTER — Telehealth: Payer: Self-pay

## 2023-09-10 LAB — CERVICOVAGINAL ANCILLARY ONLY
Bacterial Vaginitis (gardnerella): NEGATIVE
Candida Glabrata: NEGATIVE
Candida Vaginitis: POSITIVE — AB
Chlamydia: NEGATIVE
Comment: NEGATIVE
Comment: NEGATIVE
Comment: NEGATIVE
Comment: NEGATIVE
Comment: NEGATIVE
Comment: NORMAL
Neisseria Gonorrhea: NEGATIVE
Trichomonas: NEGATIVE

## 2023-09-10 MED ORDER — FLUCONAZOLE 150 MG PO TABS: 150.0000 mg | ORAL_TABLET | Freq: Once | ORAL | 0 refills | Status: AC

## 2023-09-10 NOTE — Telephone Encounter (Signed)
Per protocol, pt requires tx with Diflucan. Attempted to reach patient x1. LVM. Rx sent to pharmacy on file.

## 2023-09-17 ENCOUNTER — Encounter (HOSPITAL_COMMUNITY): Payer: Self-pay

## 2023-09-17 ENCOUNTER — Ambulatory Visit (INDEPENDENT_AMBULATORY_CARE_PROVIDER_SITE_OTHER): Payer: MEDICAID | Admitting: Mental Health

## 2023-09-17 DIAGNOSIS — F4310 Post-traumatic stress disorder, unspecified: Secondary | ICD-10-CM

## 2023-09-17 DIAGNOSIS — F323 Major depressive disorder, single episode, severe with psychotic features: Secondary | ICD-10-CM | POA: Diagnosis not present

## 2023-09-17 DIAGNOSIS — F411 Generalized anxiety disorder: Secondary | ICD-10-CM

## 2023-09-17 NOTE — Progress Notes (Signed)
THERAPIST PROGRESS NOTE Virtual Visit via Video Note  I connected with Kristine Garcia on 09/17/23 at  3:00 PM EDT by a video enabled telemedicine application and verified that I am speaking with the correct person using two identifiers.  Location: Patient: friends home- [place] Provider: home office   I discussed the limitations of evaluation and management by telemedicine and the availability of in person appointments. The patient expressed understanding and agreed to proceed.  I discussed the assessment and treatment plan with the patient. The patient was provided an opportunity to ask questions and all were answered. The patient agreed with the plan and demonstrated an understanding of the instructions.   The patient was advised to call back or seek an in-person evaluation if the symptoms worsen or if the condition fails to improve as anticipated.  I provided 54 minutes of non-face-to-face time during this encounter.   Kristine Garcia, [facility]   Session Time: 3:07 pm( 54 minutes)  Participation Level: Active  Behavioral Response: CasualAlertAdequate  Type of Therapy: Individual Therapy  Treatment Goals addressed:  STG: Kiera will increase stability in the community AEB daily medication management and connection to needed resources in the community within the next 90 days.   ProgressTowards Goals: Initial  Interventions: CBT and Supportive  Summary: Kristine Garcia is a 27 y.o. female who presents with dx of MDD with psychotic features, PTSD and GAD. Presents for session alert and oriented; mood and affect low; dysphoric. Tearful at times. Speech clear and coherent at normal rate and tone. Notes be staying with a friend and shares to be able to hold confidential session. Notes ongoing feelings of depression and has not been working for the past x 3 weeks, noting to be taking a LOA. Shares has not followed through with medication management services into having the funds to  secure any prescribed medications. Shares to feel as if she is dissociating from depressive sxs and attempting to take things one day at a time. Shares prays as means of coping with ongoing negative thoughts. Denies suicidal thoughts. Engaged with treatment planning with desire to increase stability in community as main stressor increasing sxs of depression and anxiety. Shares with therapist hx of having to take care of others and would like to nurture self and heal her inner child. Shares would like own place, one day have children and be able to bake. Receptive of information on CST and agrees to referral and for dx, intpt hx , DOB and contact information to be shared with [facility] CST services for referral. Agrees to treatment plan. Denies current SI/HI. Initial treatment plan. No change in sxs.    Suicidal/Homicidal: Nowithout intent/plan  Therapist Response: Therapist engaged [facility] in tele-therapy session.Assessed for current confidentiality and current location. Reviewed informed consent and bounds of confidentiality. Provided safe space to share thoughts and feelings in regards to current stressors. Provided support and encouragement; validated feelings. Reviewed intake assessment and assessed for current level and sxs of depression and anxiety. Assessed for safety concerns. Educated on benefits of medication management and encouraged to reschedule and present to appointment. Assessed needs and barriers to stability in community. Educated on CST services. Agreed to place referral and placed referral in session with pt permission of identifying information. Reviewed plan and reviewed session. No safety concerns present.   Plan: Return again in x 6 weeks.  Diagnosis: Severe major depression with psychotic features (HCC)  PTSD (post-traumatic stress disorder)  GAD (generalized anxiety disorder)  Collaboration of Care: Other  Medication management and CST referral with [facility] placed   Patient/Guardian was  advised Release of Information must be obtained prior to any record release in order to collaborate their care with an outside provider. Patient/Guardian was advised if they have not already done so to contact the registration department to sign all necessary forms in order for [place] to release information regarding their care.   Consent: Patient/Guardian gives verbal consent for treatment and assignment of benefits for services provided during this visit. Patient/Guardian expressed understanding and agreed to proceed.   Stephan Minister [place], [facility] 09/17/2023

## 2023-09-24 MED ORDER — FLUCONAZOLE 150 MG PO TABS: 150.0000 mg | ORAL_TABLET | Freq: Every day | ORAL | 0 refills | Status: AC

## 2023-09-24 NOTE — Addendum Note (Signed)
Addended by: Warren Danes on: 09/24/2023 06:11 PM   Modules accepted: Orders

## 2023-10-01 ENCOUNTER — Telehealth (HOSPITAL_COMMUNITY): Payer: Self-pay

## 2023-11-12 ENCOUNTER — Telehealth (HOSPITAL_COMMUNITY): Payer: Self-pay | Admitting: Mental Health

## 2023-11-12 ENCOUNTER — Encounter (HOSPITAL_COMMUNITY): Payer: Self-pay

## 2023-11-12 ENCOUNTER — Ambulatory Visit (HOSPITAL_COMMUNITY): Payer: MEDICAID | Admitting: Mental Health

## 2023-11-12 NOTE — Telephone Encounter (Signed)
Pt scheduled for OPT 11/12/23; Pt accepted with [employer] Team with [facility]. No OPT follow up at this time.
